# Patient Record
Sex: Female | Born: 1975 | Hispanic: No | Marital: Married | Smoking: Never smoker
Health system: Southern US, Community
[De-identification: ages and names within clinical notes are randomized; demographics above are authoritative.]

## PROBLEM LIST (undated history)

## (undated) DIAGNOSIS — T7840XA Allergy, unspecified, initial encounter: Secondary | ICD-10-CM

## (undated) DIAGNOSIS — N750 Cyst of Bartholin's gland: Secondary | ICD-10-CM

## (undated) DIAGNOSIS — B977 Papillomavirus as the cause of diseases classified elsewhere: Secondary | ICD-10-CM

## (undated) DIAGNOSIS — J302 Other seasonal allergic rhinitis: Secondary | ICD-10-CM

## (undated) DIAGNOSIS — N870 Mild cervical dysplasia: Secondary | ICD-10-CM

## (undated) DIAGNOSIS — Z5189 Encounter for other specified aftercare: Secondary | ICD-10-CM

## (undated) HISTORY — DX: Mild cervical dysplasia: N87.0

## (undated) HISTORY — PX: COSMETIC SURGERY: SHX468

## (undated) HISTORY — DX: Cyst of Bartholin's gland: N75.0

## (undated) HISTORY — DX: Allergy, unspecified, initial encounter: T78.40XA

## (undated) HISTORY — DX: Papillomavirus as the cause of diseases classified elsewhere: B97.7

## (undated) HISTORY — PX: BARTHOLIN CYST MARSUPIALIZATION: SHX5383

## (undated) HISTORY — DX: Other seasonal allergic rhinitis: J30.2

## (undated) HISTORY — DX: Encounter for other specified aftercare: Z51.89

---

## 1999-09-01 ENCOUNTER — Other Ambulatory Visit: Admission: RE | Admit: 1999-09-01 | Discharge: 1999-09-01 | Payer: Self-pay | Admitting: Internal Medicine

## 2001-03-26 ENCOUNTER — Other Ambulatory Visit: Admission: RE | Admit: 2001-03-26 | Discharge: 2001-03-26 | Payer: Self-pay | Admitting: Gynecology

## 2002-11-03 ENCOUNTER — Other Ambulatory Visit: Admission: RE | Admit: 2002-11-03 | Discharge: 2002-11-03 | Payer: Self-pay | Admitting: Gynecology

## 2003-05-22 ENCOUNTER — Inpatient Hospital Stay (HOSPITAL_COMMUNITY): Admission: AD | Admit: 2003-05-22 | Discharge: 2003-05-22 | Payer: Self-pay | Admitting: Gynecology

## 2003-05-27 ENCOUNTER — Inpatient Hospital Stay (HOSPITAL_COMMUNITY): Admission: AD | Admit: 2003-05-27 | Discharge: 2003-05-31 | Payer: Self-pay | Admitting: Gynecology

## 2003-05-28 ENCOUNTER — Encounter (INDEPENDENT_AMBULATORY_CARE_PROVIDER_SITE_OTHER): Payer: Self-pay | Admitting: Specialist

## 2003-06-01 ENCOUNTER — Encounter: Admission: RE | Admit: 2003-06-01 | Discharge: 2003-07-01 | Payer: Self-pay | Admitting: Gynecology

## 2003-06-25 ENCOUNTER — Ambulatory Visit (HOSPITAL_COMMUNITY): Admission: RE | Admit: 2003-06-25 | Discharge: 2003-06-25 | Payer: Self-pay | Admitting: Gynecology

## 2003-06-25 ENCOUNTER — Ambulatory Visit (HOSPITAL_BASED_OUTPATIENT_CLINIC_OR_DEPARTMENT_OTHER): Admission: RE | Admit: 2003-06-25 | Discharge: 2003-06-25 | Payer: Self-pay | Admitting: Gynecology

## 2003-07-27 ENCOUNTER — Other Ambulatory Visit: Admission: RE | Admit: 2003-07-27 | Discharge: 2003-07-27 | Payer: Self-pay | Admitting: Gynecology

## 2005-01-03 ENCOUNTER — Other Ambulatory Visit: Admission: RE | Admit: 2005-01-03 | Discharge: 2005-01-03 | Payer: Self-pay | Admitting: Gynecology

## 2006-03-29 ENCOUNTER — Other Ambulatory Visit: Admission: RE | Admit: 2006-03-29 | Discharge: 2006-03-29 | Payer: Self-pay | Admitting: Gynecology

## 2007-05-08 ENCOUNTER — Other Ambulatory Visit: Admission: RE | Admit: 2007-05-08 | Discharge: 2007-05-08 | Payer: Self-pay | Admitting: Gynecology

## 2008-05-08 ENCOUNTER — Encounter: Payer: Self-pay | Admitting: Gynecology

## 2008-05-08 ENCOUNTER — Other Ambulatory Visit: Admission: RE | Admit: 2008-05-08 | Discharge: 2008-05-08 | Payer: Self-pay | Admitting: Gynecology

## 2008-05-08 ENCOUNTER — Ambulatory Visit: Payer: Self-pay | Admitting: Gynecology

## 2008-05-11 DIAGNOSIS — B977 Papillomavirus as the cause of diseases classified elsewhere: Secondary | ICD-10-CM

## 2008-05-11 DIAGNOSIS — N870 Mild cervical dysplasia: Secondary | ICD-10-CM

## 2008-05-11 HISTORY — DX: Papillomavirus as the cause of diseases classified elsewhere: B97.7

## 2008-05-11 HISTORY — DX: Mild cervical dysplasia: N87.0

## 2008-05-14 ENCOUNTER — Ambulatory Visit: Payer: Self-pay | Admitting: Gynecology

## 2008-11-03 ENCOUNTER — Other Ambulatory Visit: Admission: RE | Admit: 2008-11-03 | Discharge: 2008-11-03 | Payer: Self-pay | Admitting: Gynecology

## 2008-11-03 ENCOUNTER — Encounter: Payer: Self-pay | Admitting: Gynecology

## 2008-11-03 ENCOUNTER — Ambulatory Visit: Payer: Self-pay | Admitting: Gynecology

## 2009-02-02 ENCOUNTER — Ambulatory Visit: Payer: Self-pay | Admitting: Gynecology

## 2009-05-14 ENCOUNTER — Other Ambulatory Visit: Admission: RE | Admit: 2009-05-14 | Discharge: 2009-05-14 | Payer: Self-pay | Admitting: Gynecology

## 2009-05-14 ENCOUNTER — Ambulatory Visit: Payer: Self-pay | Admitting: Gynecology

## 2009-05-21 ENCOUNTER — Ambulatory Visit: Payer: Self-pay | Admitting: Gynecology

## 2010-05-19 ENCOUNTER — Other Ambulatory Visit: Payer: Self-pay | Admitting: Gynecology

## 2010-05-19 ENCOUNTER — Ambulatory Visit
Admission: RE | Admit: 2010-05-19 | Discharge: 2010-05-19 | Payer: Self-pay | Source: Home / Self Care | Attending: Gynecology | Admitting: Gynecology

## 2010-05-19 ENCOUNTER — Other Ambulatory Visit (HOSPITAL_COMMUNITY)
Admission: RE | Admit: 2010-05-19 | Discharge: 2010-05-19 | Disposition: A | Discharge status: Home / Self Care | Payer: PRIVATE HEALTH INSURANCE | Source: Ambulatory Visit | Attending: Gynecology | Admitting: Gynecology

## 2010-05-19 DIAGNOSIS — Z124 Encounter for screening for malignant neoplasm of cervix: Secondary | ICD-10-CM | POA: Insufficient documentation

## 2010-06-03 ENCOUNTER — Other Ambulatory Visit: Payer: Self-pay

## 2010-06-23 ENCOUNTER — Other Ambulatory Visit (INDEPENDENT_AMBULATORY_CARE_PROVIDER_SITE_OTHER): Payer: PRIVATE HEALTH INSURANCE

## 2010-06-23 DIAGNOSIS — E559 Vitamin D deficiency, unspecified: Secondary | ICD-10-CM

## 2010-07-07 ENCOUNTER — Other Ambulatory Visit: Payer: PRIVATE HEALTH INSURANCE
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
## 2010-09-09 NOTE — Discharge Summary (Signed)
NAME:  Heather Orozco, Heather Orozco                         ACCOUNT NO.:  [id number]   MEDICAL RECORD NO.:  [id number]                   PATIENT TYPE:  INP   LOCATION:  9146                                 FACILITY:  [facility]   PHYSICIAN:  Juan H. Lily Peer, M.D.             DATE OF BIRTH:  [birth year]   DATE OF ADMISSION:  05/27/2003  DATE OF DISCHARGE:  05/31/2003                                 DISCHARGE SUMMARY   DISCHARGE DIAGNOSES:  1. Intrauterine pregnancy at 39+ weeks delivered.  2. Nonreassuring fetal heart rate tracing.  3. Intrauterine growth restriction versus small for gestational age.  4. Postpartum uterine atony status post primary lower uterine segment     transverse cesarean section by Dr. Reynaldo Minium on May 28, 2003.   HISTORY:  This is a 35 years of age female gravida 1, para 0 with an EDC of  May 31, 2003.  Prenatal course had been complicated by having a  Bartholin abscess which was drained, placed on antibiotics and a Word  catheter removed.  Was also followed in pregnancy for suspected IUGR versus  small for gestational age with serial ultrasounds.  Last ultrasound on  January 24th demonstrated estimated fetal age by ultrasound 37 weeks,  estimated fetal weight of 3055 grams in the 35th percentile with 38 weeks  and abdominal circumference consistent with 35 weeks and 6 days.  Amniotic  fluid index was 9.5 which is 20th percentile for 38 weeks.  Doppler flow  studies were normal.  The patient was admitted for induction secondary to  same.   HOSPITAL COURSE:  On May 27, 2003, the patient was admitted, Cervidil  was placed and on May 28, 2003 the patient was begun on Pitocin.  There  was a flat baseline that they thought secondary to Stadol x2 but persisted  to have decreased long-term variability on fetal heart rate tracing and  after fetal scalp stimulation there was a fetal heart rate deceleration.  Therefore, the diagnosis of nonreassuring fetal heart rate  tracing was made  and therefore the patient underwent a primary lower uterine segment  transverse cesarean section by Dr. Reynaldo Minium on May 28, 2003 and  underwent delivery of a female.  Apgars of 9 and 9, weight of 6 pounds and 9  ounces.  It was noted that there was postpartum uterine atony and she was  given Pitocin, Methergine, and Hemabate which were injected into the  myometrium along with manual pressure and eventually the uterus regained its  tonicity.  Postoperatively, the patient remained afebrile, voiding, in  stable condition and she was discharged to home on May 31, 2003 and  given [facility] postoperative instructions and postpartum  booklet.   LABORATORY DATA:  The patient is O positive, rubella immune.  On May 29, 2003, hemoglobin was 10.3.   DISPOSITION:  The patient is discharged to home.  She was to follow up in  24  hours to discontinue staples.  She was given a prescription for Darvocet  p.r.n. pain, dispense #30.  If she had any problems prior to then, please  see [place] in the office.     Susa Loffler, P.A.                    Juan H. Lily Peer, M.D.    TSG/MEDQ  D:  06/29/2003  T:  06/29/2003  Job:  [id number]
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
## 2010-09-09 NOTE — Op Note (Signed)
NAME:  Heather Orozco, Heather Orozco                         ACCOUNT NO.:  [id number]   MEDICAL RECORD NO.:  [id number]                   PATIENT TYPE:  INP   LOCATION:  9146                                 FACILITY:  [facility]   PHYSICIAN:  Juan H. Lily Peer, M.D.             DATE OF BIRTH:  [birth year]   DATE OF PROCEDURE:  05/28/2003  DATE OF DISCHARGE:                                 OPERATIVE REPORT   INDICATION FOR OPERATION:  A 35 year old, gravida 1, para 0, at 39-1/2 weeks  estimated gestational age, who was admitted last night for induction,  secondary suspicion of SGA versus IUGR.  The patient was started on Cervidil  and had received intravenous narcotics for contraction pains during the  night and after the effects wore off, persistence of decreased long-term  variability on fetal heart tracing was evident and after fetal scalp  stimulation, there was fetal heart rate deceleration.  The patient had been  kept on O2 in lateral decubitus position, and she had progressed only to 3  cm, 80%, -2 station.  Clear amniotic fluid and was afebrile.   PREOPERATIVE DIAGNOSES:  1. Term intrauterine pregnancy.  2. Nonreassuring fetal heart rate tracing.  3. Intrauterine growth restriction versus small for gestational age.   POSTOPERATIVE DIAGNOSES:  1. Term intrauterine pregnancy.  2. Nonreassuring fetal heart rate tracing.  3. Small for gestational age.  4. Postpartum uterine atony.   ANESTHESIA:  Epidural.   SURGEON:  Juan H. Lily Peer, M.D.   PROCEDURE PERFORMED:  Primary lower uterine segment transverse cesarean  section.   FINDINGS:  Clear amniotic fluid.  Viable female infant, Apgars of 9 and 9  with a weight of 6 pounds 9 ounces.  Arterial cord pH is 7.22, normal  maternal pelvic anatomy.   DESCRIPTION OF OPERATION:  After the patient was adequately counseled, she  was taken to the operating room where she was re-dosed through her epidural  catheter, and her abdomen was prepped and  draped in the usual sterile  fashion.  A Foley catheter was inserted in an effort to monitor urinary  output.  A Pfannenstiel skin incision was made 2 cm above the symphysis  pubis, and the incision was carried down through the skin, subcutaneous  tissue, down to the rectus fascia, whereby a midline nick was made.  The  fascia was incised in transverse fashion.  Clear amniotic fluid was present.  The newborn was delivered without any complication.  The cord was doubly  clamped and excised.  The nasopharyngeal area was bulb suctioned.  The  newborn gave immediate cry, was shown to the parents, and passed off to the  pediatricians who were in attendance.  After cord blood was obtained, the  placenta was delivered from the intrauterine cavity and submitted for  histological evaluation.  Pitocin drip was started along with Cefotan 1 g IV  for prophylaxis during the closure.  It was noted  that the uterus was  atonic.  She was given 40 units of Pitocin and later LVAR and received  Methergine 0.2 mg IM and also Hemabate 250 mcg were injected into the  myometrium along with manual pressure and eventually the uterus regained its  tonicity.  The closure was completed with a 0 Vicryl suture in the  transverse uterine incision.  There was normal-appearing tubes and ovaries.  The uterus was placed back into the pelvic cavity.  The pelvic cavity was  copiously irrigated with normal saline solution.  The visceral peritoneum  was not reapproximated, but the rectus fascia was closed with a running  stitch of 0 Vicryl suture.  The subcutaneous bleeders were Bovie cauterized;  the skin was reapproximated with skin clips followed by placing Xeroform  gauze and 4 x 8 dressing.  The patient was transferred to recovery room with  stable vital signs.  Blood loss was 800 mL.  IV fluid was 2300 mL of  lactated Ringer's.  Urine output 250 mL and clear.  She received a second  dose of Methergine 0.2 mg IM and will  continue to receive same dosage but  every 4 hours for 4 additional doses.                                               Juan H. Lily Peer, M.D.    JHF/MEDQ  D:  05/28/2003  T:  05/28/2003  Job:  [id number]

## 2010-09-09 NOTE — Op Note (Signed)
NAME:  Heather Orozco, Heather Orozco                         ACCOUNT NO.:  [id number]   MEDICAL RECORD NO.:  [id number]                   PATIENT TYPE:  AMB   LOCATION:  [facility]                                 FACILITY:  [facility]   PHYSICIAN:  Juan H. Lily Peer, M.D.             DATE OF BIRTH:  [birth year]   DATE OF PROCEDURE:  06/25/2003  DATE OF DISCHARGE:                                 OPERATIVE REPORT   INDICATION FOR OPERATION:  A 35 year old with recurrent left Bartholin gland  cyst/abscess.   PREOPERATIVE DIAGNOSIS:  Left Bartholin gland cyst/abscess, recurrent.   POSTOPERATIVE DIAGNOSIS:  Left Bartholin gland cyst/abscess, recurrent.   ANESTHESIA:  Intravenous sedation with local 1% lidocaine.   SURGEON:  Juan H. Lily Peer, M.D.   PROCEDURE PERFORMED:  Marsupialization of recurrent left Bartholin gland.   FINDINGS:  A 3 x 4 cm Bartholin gland, inflamed upon incision into the  gland.  Purulent material was evident, consistent with a Bartholin gland  abscess.  Contralateral Bartholin gland was normal.   DESCRIPTION OF OPERATION:  After the patient was adequately counseled, she  was taken to the operating room where after intravenous sedation, her vagina  and perineum were prepped and draped in the usual sterile fashion.  A red  rubber Roxan Hockey was inserted to evacuate her bladder's content for  approximately 50 mL.  Xylocaine 1% was infiltrated into the submucosa  overlying the inflamed Bartholin gland after Betadine solution was used to  prep the area.  A stab incision was made, and purulent discharge was  evident.  The gland cavity was irrigated with copious amounts of normal  saline solution, and the gland edges were everted and then sutured  independently with 3-0 Vicryl suture to the vaginal mucosa to keep it patent  for several weeks.  After the marsupialization was completed, the area was  irrigated once again, and the patient was transferred to recovery room with  stable vital signs.   Blood loss was minimal.  Fluid resuscitation consisted  of 800 mL of lactated Ringer's.  She did receive 2 g of Cefotan IV and was  transferred to recovery room with stable vital sign, and she was given 30 mg  of Toradol IV en route to the recovery room.                                               Juan H. Lily Peer, M.D.    JHF/MEDQ  D:  06/25/2003  T:  06/25/2003  Job:  [id number]

## 2011-05-26 ENCOUNTER — Encounter: Payer: PRIVATE HEALTH INSURANCE | Admitting: Gynecology

## 2011-06-12 ENCOUNTER — Encounter: Payer: Self-pay | Admitting: Gynecology

## 2011-06-12 ENCOUNTER — Ambulatory Visit (INDEPENDENT_AMBULATORY_CARE_PROVIDER_SITE_OTHER): Payer: PRIVATE HEALTH INSURANCE | Admitting: Gynecology

## 2011-06-12 VITALS — BP 118/78 | Ht 65.0 in | Wt 130.0 lb

## 2011-06-12 DIAGNOSIS — Z0000 Encounter for general adult medical examination without abnormal findings: Secondary | ICD-10-CM

## 2011-06-12 DIAGNOSIS — Z01419 Encounter for gynecological examination (general) (routine) without abnormal findings: Secondary | ICD-10-CM

## 2011-06-12 LAB — URINALYSIS W MICROSCOPIC + REFLEX CULTURE
Leukocytes, UA: NEGATIVE
Nitrite: NEGATIVE
Protein, ur: NEGATIVE mg/dL
Specific Gravity, Urine: 1.02 (ref 1.005–1.030)
Urobilinogen, UA: 0.2 mg/dL (ref 0.0–1.0)

## 2011-06-12 LAB — CBC WITH DIFFERENTIAL/PLATELET
Basophils Absolute: 0 10*3/uL (ref 0.0–0.1)
Basophils Relative: 1 % (ref 0–1)
HCT: 40 % (ref 36.0–46.0)
Lymphocytes Relative: 22 % (ref 12–46)
MCHC: 32.8 g/dL (ref 30.0–36.0)
Monocytes Absolute: 0.4 10*3/uL (ref 0.1–1.0)
Neutro Abs: 4.7 10*3/uL (ref 1.7–7.7)
Neutrophils Relative %: 72 % (ref 43–77)
Platelets: 239 10*3/uL (ref 150–400)
RDW: 11.9 % (ref 11.5–15.5)
WBC: 6.6 10*3/uL (ref 4.0–10.5)

## 2011-06-12 NOTE — Progress Notes (Signed)
Renda Pohlman 1976-03-22 [id number]   History:    36 y.o.  for annual exam with no complaints. Patient a Mirena IUD placed December 2008. Patient does her monthly self breast examination. 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  Past medical history,surgical history, family history and social history were all reviewed and documented in the EPIC chart.  Gynecologic History Patient's last menstrual period was 06/08/2011. Contraception: IUD Last Pap: 2012. Results were:normal} Last mammogram: No prior study. Results were: No prior study  Obstetric History OB History    Grav Para Term Preterm Abortions TAB SAB Ect Mult Living   1 1 1       1      # Outc Date GA Lbr Len/2nd Wgt Sex Del Anes PTL Lv   1 TRM     F CS  No Yes       ROS:  Was performed and pertinent positives and negatives are included in the history.  Exam: chaperone present  BP 118/78  Ht 5\' 5"  (1.651 m)  Wt 130 lb (58.968 kg)  BMI 21.63 kg/m2  LMP 06/08/2011  Body mass index is 21.63 kg/(m^2).  General appearance : Well developed well nourished female. No acute distress HEENT: Neck supple, trachea midline, no carotid bruits, no thyroidmegaly Lungs: Clear to auscultation, no rhonchi or wheezes, or rib retractions  Heart: Regular rate and rhythm, no murmurs or gallops Breast:Examined in sitting and supine position were symmetrical in appearance, no palpable masses or tenderness,  no skin retraction, no nipple inversion, no nipple discharge, no skin discoloration, no axillary or supraclavicular lymphadenopathy Abdomen: no palpable masses or tenderness, no rebound or guarding Extremities: no edema or skin discoloration or tenderness  Pelvic:  Bartholin, Urethra, Skene Glands: Within normal limits             Vagina: No gross lesions or discharge  Cervix: No gross lesions or discharge, IUD string seen  Uterus  anteverted, normal size, shape and consistency, non-tender and mobile  Adnexa   Without masses or tenderness  Anus and perineum  normal   Rectovaginal  normal sphincter tone without palpated masses or tenderness             Hemoccult not done     Assessment/Plan:  36 y.o. female for annual exam unremarkable. New screening guidelines her Pap smear discussed. She will have a Pap smear in 2 years. Patient knows that she needs to come back to the office in November to change her IUD which will be expired. She was instructed to continue monthly self breast examinations. We discussed importance of calcium and vitamin D as well as regular exercise for osteoporosis prevention. CBC urinalysis and and cholesterol was obtained today.    Ok Edwards MD, 2:30 PM 06/12/2011

## 2011-06-12 NOTE — Patient Instructions (Signed)
Calcium requirement 1200mg  daily and vitamin D 1000-2000

## 2011-06-13 LAB — LIPID PANEL
Cholesterol: 122 mg/dL (ref 0–200)
Total CHOL/HDL Ratio: 3.3 Ratio
VLDL: 10 mg/dL (ref 0–40)

## 2012-01-19 ENCOUNTER — Telehealth: Payer: Self-pay | Admitting: Gynecology

## 2012-01-19 NOTE — Telephone Encounter (Signed)
Pt was advised today that her [employer] ins covers the removal of her existing Mirena, the new Mirena IUD and its insertion Under her $25.00 ov copay. She will call back to shedule an appt for November per JF notes/WL

## 2012-02-23 HISTORY — PX: OTHER SURGICAL HISTORY: SHX169

## 2012-03-20 ENCOUNTER — Ambulatory Visit (INDEPENDENT_AMBULATORY_CARE_PROVIDER_SITE_OTHER): Payer: PRIVATE HEALTH INSURANCE | Admitting: Gynecology

## 2012-03-20 ENCOUNTER — Other Ambulatory Visit: Payer: Self-pay | Admitting: Gynecology

## 2012-03-20 ENCOUNTER — Encounter: Payer: Self-pay | Admitting: Gynecology

## 2012-03-20 VITALS — BP 116/70

## 2012-03-20 DIAGNOSIS — Z3049 Encounter for surveillance of other contraceptives: Secondary | ICD-10-CM

## 2012-03-20 DIAGNOSIS — Z30433 Encounter for removal and reinsertion of intrauterine contraceptive device: Secondary | ICD-10-CM

## 2012-03-20 MED ORDER — LEVONORGESTREL 20 MCG/24HR IU IUD: INTRAUTERINE_SYSTEM | Freq: Once | INTRAUTERINE | Status: DC

## 2012-03-20 NOTE — Progress Notes (Signed)
Patient presented to the office today to change her Mirena IUD. It was last placed in December 2008. Patient has done well with no problems. Patient read the literature information. Consent form was signed. Patient's fully where this form of contraception is good for 5 years and is 99% effective.  Exam : Pelvic Bartholin urethra Skene was within normal limits Vagina: No lesions or discharge Cervix: No lesions or discharge Uterus: Anteverted normal size shape and consistency Adnexa: No palpable masses or tenderness Rectal exam: Not done  Procedure note: The cervix was cleansed with Betadine solution. The IUD string was visualized and grasped with a [place] clamp and the IUD was retrieved shown to the patient and discarded. A single-tooth tenaculum was placed on the anterior cervical lip. The uterus sounded to 8 cm. A sterile Mirena IUD was introduced and placed into the intrauterine cavity and the string was trimmed. The single-tooth tenaculum was removed. Patient tolerated procedure well. Patient was given 2 Aleve.  Patient will return first week in February for her annual exam in for followup on the IUD. The string is tomorrow and her partner feels it she will return before to have the string trimmed. Patient refused the flu vaccine.

## 2012-03-20 NOTE — Patient Instructions (Addendum)
Intrauterine Device Information  An intrauterine device (IUD) is inserted into your uterus and prevents pregnancy. There are 2 types of IUDs available:  · Copper IUD. This type of IUD is wrapped in copper wire and is placed inside the uterus. Copper makes the uterus and fallopian tubes produce a fluid that kills sperm. The copper IUD can stay in place for 10 years.  · Hormone IUD. This type of IUD contains the hormone progestin (synthetic progesterone). The hormone thickens the cervical mucus and prevents sperm from entering the uterus, and it also thins the uterine lining to prevent implantation of a fertilized egg. The hormone can weaken or kill the sperm that get into the uterus. The hormone IUD can stay in place for 5 years.  Your caregiver will make sure you are a good candidate for a contraceptive IUD. Discuss with your caregiver the possible side effects.  ADVANTAGES  · It is highly effective, reversible, long-acting, and low maintenance.  · There are no estrogen-related side effects.  · An IUD can be used when breastfeeding.  · It is not associated with weight gain.  · It works immediately after insertion.  · The copper IUD does not interfere with your female hormones.  · The progesterone IUD can make heavy menstrual periods lighter.  · The progesterone IUD can be used for 5 years.  · The copper IUD can be used for 10 years.  DISADVANTAGES  · The progesterone IUD can be associated with irregular bleeding patterns.  · The copper IUD can make your menstrual flow heavier and more painful.  · You may experience cramping and vaginal bleeding after insertion.  Document Released: 03/14/2004 Document Revised: 07/03/2011 Document Reviewed: 08/13/2010  [employer]® Patient Information ©2013 [employer].

## 2012-06-06 ENCOUNTER — Encounter: Payer: PRIVATE HEALTH INSURANCE | Admitting: Gynecology

## 2012-06-13 ENCOUNTER — Encounter: Payer: PRIVATE HEALTH INSURANCE | Admitting: Gynecology

## 2012-06-24 ENCOUNTER — Encounter: Payer: PRIVATE HEALTH INSURANCE | Admitting: Gynecology

## 2012-06-25 ENCOUNTER — Ambulatory Visit (INDEPENDENT_AMBULATORY_CARE_PROVIDER_SITE_OTHER): Payer: PRIVATE HEALTH INSURANCE | Admitting: Gynecology

## 2012-06-25 ENCOUNTER — Encounter: Payer: Self-pay | Admitting: Gynecology

## 2012-06-25 VITALS — BP 108/70 | Ht 65.25 in | Wt 142.0 lb

## 2012-06-25 DIAGNOSIS — Z01419 Encounter for gynecological examination (general) (routine) without abnormal findings: Secondary | ICD-10-CM

## 2012-06-25 NOTE — Progress Notes (Signed)
Heather Orozco 14-Apr-1976 [id number]   History:    36 y.o.  for annual gyn exam with no complaints today. Patient was seen in the office in November 2013 whereby her Mirena IUD was changed. Patient states she's having light menstrual cycles. She is doing her monthly self breast examination. 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  Gynecologic History Patient's last menstrual period was 06/23/2012. Contraception: IUD Last Pap: 2012. Results were: normal Last mammogram: not indicated. Results were: not indicated  Obstetric History OB History   Grav Para Term Preterm Abortions TAB SAB Ect Mult Living   1 1 1       1      # Outc Date GA Lbr Len/2nd Wgt Sex Del Anes PTL Lv   1 TRM     F CS  No Yes       ROS: A ROS was performed and pertinent positives and negatives are included in the history.  GENERAL: No fevers or chills. HEENT: No change in vision, no earache, sore throat or sinus congestion. NECK: No pain or stiffness. CARDIOVASCULAR: No chest pain or pressure. No palpitations. PULMONARY: No shortness of breath, cough or wheeze. GASTROINTESTINAL: No abdominal pain, nausea, vomiting or diarrhea, melena or bright red blood per rectum. GENITOURINARY: No urinary frequency, urgency, hesitancy or dysuria. MUSCULOSKELETAL: No joint or muscle pain, no back pain, no recent trauma. DERMATOLOGIC: No rash, no itching, no lesions. ENDOCRINE: No polyuria, polydipsia, no heat or cold intolerance. No recent change in weight. HEMATOLOGICAL: No anemia or easy bruising or bleeding. NEUROLOGIC: No headache, seizures, numbness, tingling or weakness. PSYCHIATRIC: No depression, no loss of interest in normal activity or change in sleep  pattern.     Exam: chaperone present  BP 108/70  Ht 5' 5.25" (1.657 m)  Wt 142 lb (64.411 kg)  BMI 23.46 kg/m2  LMP 06/23/2012  Body mass index is 23.46 kg/(m^2).  General appearance : Well developed well nourished female. No acute distress HEENT: Neck supple, trachea midline, no carotid bruits, no thyroidmegaly Lungs: Clear to auscultation, no rhonchi or wheezes, or rib retractions  Heart: Regular rate and rhythm, no murmurs or gallops Breast:Examined in sitting and supine position were symmetrical in appearance, no palpable masses or tenderness,  no skin retraction, no nipple inversion, no nipple discharge, no skin discoloration, no axillary or supraclavicular lymphadenopathy Abdomen: no palpable masses or tenderness, no rebound or guarding Extremities: no edema or skin discoloration or tenderness  Pelvic:  Bartholin, Urethra, Skene Glands: Within normal limits             Vagina: No gross lesions or discharge, menstrual blood present  Cervix: No gross lesions or discharge, IUD string seen  Uterus  anteverted, normal size, shape and consistency, non-tender and mobile  Adnexa  Without masses or tenderness  Anus and perineum  normal   Rectovaginal  normal sphincter tone without palpated masses or tenderness             Hemoccult not indicated     Assessment/Plan:  37 y.o. female for annual exam who stated that she wanted to have her blood drawn done at her work since she was in a rush today. I have given her a prescription pad with the following requested lab work: CBC, fasting  lipid profile, TSH, fasting blood sugar and urinalysis. No Pap smear was done today the new guidelines were discussed. She was reminded to do her monthly self breast examination. Literature information on the Tdap vaccine was provided.    Ok Edwards MD, 10:32 AM 06/25/2012

## 2012-06-25 NOTE — Patient Instructions (Signed)

## 2012-06-27 ENCOUNTER — Encounter: Payer: PRIVATE HEALTH INSURANCE | Admitting: Gynecology

## 2013-07-31 ENCOUNTER — Encounter: Payer: Self-pay | Admitting: Gynecology

## 2013-08-15 ENCOUNTER — Other Ambulatory Visit (HOSPITAL_COMMUNITY)
Admission: RE | Admit: 2013-08-15 | Discharge: 2013-08-15 | Disposition: A | Discharge status: Home / Self Care | Payer: PRIVATE HEALTH INSURANCE | Source: Ambulatory Visit | Attending: Gynecology | Admitting: Gynecology

## 2013-08-15 ENCOUNTER — Ambulatory Visit (INDEPENDENT_AMBULATORY_CARE_PROVIDER_SITE_OTHER): Payer: PRIVATE HEALTH INSURANCE | Admitting: Gynecology

## 2013-08-15 ENCOUNTER — Encounter: Payer: Self-pay | Admitting: Gynecology

## 2013-08-15 VITALS — BP 112/76 | Ht 65.25 in | Wt 136.0 lb

## 2013-08-15 DIAGNOSIS — Z01419 Encounter for gynecological examination (general) (routine) without abnormal findings: Secondary | ICD-10-CM | POA: Insufficient documentation

## 2013-08-15 DIAGNOSIS — Z975 Presence of (intrauterine) contraceptive device: Secondary | ICD-10-CM

## 2013-08-15 DIAGNOSIS — Z1151 Encounter for screening for human papillomavirus (HPV): Secondary | ICD-10-CM | POA: Insufficient documentation

## 2013-08-15 DIAGNOSIS — Z23 Encounter for immunization: Secondary | ICD-10-CM

## 2013-08-15 DIAGNOSIS — N750 Cyst of Bartholin's gland: Secondary | ICD-10-CM | POA: Insufficient documentation

## 2013-08-15 NOTE — Patient Instructions (Addendum)
Vacuna difteria, tétanos, tos ferina (DTP) - Lo que debe saber   (Tetanus, Diphtheria, Pertussis [Tdap] Vaccine, What You Need to Know)  ¿PORQUÉ VACUNARSE?   El tétanos, la difteria y la tos ferina pueden ser enfermedades muy graves, aún en adolescentes y adultos. La vacuna Tdap nos puede proteger de estas enfermedades.   El TÉTANOS (Trismo) provoca la contracción dolorosa de los músculos, por lo general, en todo el cuerpo.   · Puede causar el endurecimiento de los músculos de la cabeza y el cuello, de modo que impide abrir la boca, tragar y en algunos casos, respirar. El tétanos causa la muerte de 1 de cada 5 personas que se infectan.  La DIFTERIA produce la formación de una membrana gruesa que cubre el fondo de la garganta.   · Puede causar problemas respiratorios, parálisis, insuficiencia cardíaca e incluso la muerte.  TOS FERINA (Pertusis) causa episodios de tos graves, que pueden hacer difícil la respiración, causar vómitos y trastornos del sueño.   · También puede ser la causa de pérdida de peso, incontinencia y fractura de costillas. Dos de cada 100 adolescentes y cinco de cada 100 adultos que enferman de pertusis deben ser hospitalizados, tienen complicaciones como la neumonía o mueren.  Estas enfermedades son provocadas por bacterias. La difteria y el pertusis se contagian de persona a persona a través de la tos o el estornudo. El tétanos ingresa al organismo a través de cortes, rasguños o heridas.   Antes de las vacunas, en los [place] se vieron más de 200.000 casos al año de difteria y tos ferina y cientos de casos de tétanos. Desde el inicio de la vacunación, los casos de tétanos y difteria han disminuido alrededor del 99% y los casos de tos ferina alrededor del 80%.   Tdap   La vacuna Tdap protege a adolescentes y adultos contra el tétanos, la difteria y la tos ferina. Una dosis de Tdap se administra a los 11 o 12 años de edad. Las personas que no recibieron la vacuna Tdap a esa edad deben  recibirla tan pronto como sea posible.   Es muy importante que los profesionales de la salud y todos aquellos que tengan contacto cercano con bebés menores de 12 meses reciban la Tdap.   Las mujeres embarazadas deben recibir una dosis de Tdap en cada embarazo, para proteger al recién nacido de la tos ferina. Los niños tienen mayor riesgo de complicaciones graves y potencialmente mortales debido a la tos ferina.   Una vacuna similar, llamada Td, protege contra el tétanos y la difteria, pero no contra la tos ferina. Cada 10 años debe recibirse un refuerzo de Td. La Tdap se puede administrar como uno de estos refuerzos, si todavía no ha recibido una dosis. También se puede aplicar después de un corte o quemadura grave para prevenir la infección por tétanos.   El médico le dará más información.   La Tdap puede administrarse de manera segura simultáneamente con otras vacunas.   ALGUNAS PERSONAS NO DEBEN RECIBIR ESTA VACUNA.   · Si alguna vez tuvo una reacción alérgica potencialmente mortal después de una dosis de la vacuna contra el tétanos, la diferia o la tos ferina, o tuvo una alergia grave a cualquiera de los componentes de esta vacuna, no debe aplicarse la vacuna. Informe a su médico si usted sufre algún tipo de alergia grave.  · Si estuvo en coma o sufrió múltiples convulsiones dentro de los 7 días posteriores después de una dosis de DTP o DTaP   su mdico si:  tiene epilepsia u otra enfermedad del sistema nervioso,  siente dolor intenso o se hincha despus de recibir cualquier vacuna contra la difteria, el ttanos o la tos ferina,  alguna vez ha sufrido el sndrome de Guillain-Barr,  no se siente bien el da en que se ha programado la vacuna. RIESGOS DE UNA REACCIN A LA VACUNA Con cualquier medicamento, incluyendo las vacunas, existe la posibilidad de que aparezcan efectos secundarios. Estos son  leves y desaparecen por s solos, pero tambin son posibles las reacciones graves.  Breves episodios de desmayo pueden seguir a una vacunacin, causando lesiones por la cada. Sentarse o recostarse durante 15 minutos puede ayudar a evitarlo. Informe al mdico si se siente mareado o aturdido, tiene cambios en la visin o zumbidos en los odos.  Problemas leves luego de la Tdap (no interferirn con las actividades)   Dolor en el sitio de la inyeccin (alrededor de 1 de cada 4 adolescentes o 2 de cada 3 adultos).  Enrojecimiento o hinchazn en el lugar de la inyeccin (1 de cada 5 personas).  Fiebre leve de al menos 100,4 F (38 C) (hasta alrededor de 1 cada 25 adolescentes y 1 de cada 100 adultos).  Dolor de cabeza (3 o 4de cada 10 personas).  Cansancio (1 de cada 3 o 4 personas).  Nuseas, vmitos, diarrea, dolor de estmago (1 de cada 4 adolescentes o 1 de cada 10 adultos).  Escalofros, dolores corporales, dolor articular, erupciones, inflamacin de las glndulas (poco frecuente). Problemas moderados: (interfieren con las actividades, pero no requieren atencin mdica)   Dolor en el lugar de la inyeccin (1 de cada 5 adolescentes o 1 de cada 100 adultos).  Enrojecimiento o inflamacin (1 de cada 16 adolescentes y 1 de cada 25 adultos).  Fiebre de ms de 102F o 38,9C (1 de cada 100 adolescentes o 1 de cada 250 adultos).  Dolor de cabeza (alrededor de 4 de cada 20 adolescentes y 3 de cada 10 adultos).  Nuseas, vmitos, diarrea, dolor de estmago (1 a 3 de cada 100 personas).  Hinchazn de todo el brazo en el que se aplic la vacuna (3 de cada100 personas). Problemas graves: luego de la Tdap (no puede realizar las actividades habituales, requiere atencin mdica)   Inflamacin, dolor intenso, sangrado y enrojecimiento en el brazo, en el sitio de la inyeccin (poco frecuente). Una reaccin alrgica grave puede ocurrir despus de la administracin de cualquier vacuna (se estima en  menos de 1 en un milln de dosis).  QU PASA SI HAY UNA REACCIN GRAVE?  Qu signos debo buscar?  Observe todo lo que le preocupe, como signos de una reaccin alrgica grave, fiebre muy alta o cambios en el comportamiento. Los signos de una reaccin alrgica grave pueden incluir urticaria, hinchazn de la cara y la garganta, dificultad para respirar, ritmo cardaco acelerado, mareos y debilidad. Estos sntomas pueden comenzar entre unos pocos minutos y algunas horas despus de la vacunacin.  Qu debo hacer?  Si usted piensa que se trata de una reaccin alrgica grave o de otra emergencia que no puede esperar, llame al 911 o lleve a la persona al hospital ms cercano. De lo contrario, llame a su mdico.  Despus, la reaccin debe informarse a la "Vaccine Adverse Event Reporting System" (Sistema de informacin sobre efectos adversos de las vacunas -VAERS). El mdico o usted mismo pueden realizar el informe en el sitio web del VAERS www.vaers.hhs.govo llame al 1-800-822-7967. El VAERS es slo para informar reacciones. No   brindan consejo mdico.  PROGRAMA NACIONAL DE COMPENSACIN DE DAOS POR VACUNAS  El National Vaccine Injury Kohl'sCompensation Program (VICP) es un programa federal que fue creado para compensar a las personas que puedan haber sufrido daos al recibir ciertas vacunas.  Aquellas personas que consideren que han sufrido un dao como consecuencia de una vacuna y quieren saber ms acerca del programa y como presentar Roslynn Ambleuna denuncia, pueden llamar [phone] o visite el sitio web del VICP en SpiritualWord.atwww.hrsa.gov/vaccinecompensation.  CMO PUEDO OBTENER MS INFORMACIN?   Consulte a su mdico.  Comunquese con el servicio de salud de su localidad o [address].  Comunquese con los Centros para el control y la prevencin de Child psychotherapistenfermedades (Centers for Disease Control and Prevention , CDC).  llamando al [phone] o visitando el sitio web del CDC en PicCapture.uywww.cdc.gov/vaccines. CDC Tdap Vaccine VIS  (08/31/11)  Document Released: 03/27/2012 [facility] Patient Information 2014 [place]ExitCare, [place]LL[place]. Absceso o quiste de Bartolino (Bartholin's Cyst or Abscess) Las glndulas de Bartolino son glndulas pequeas ubicadas dentro de los pliegues de la piel (labios) a los lados de la apertura de la vagina (canal del parto). Cuando el conducto de la glndula se Hiberniaobstruye, puede desarrollarse un quiste. Cuando esto ocurre, el lquido que se acumula dentro del quiste puede llegar a infectarse. Esto se conoce como absceso. La glndula de Bartolino produce una mucosidad lquida en la parte externa de la vagina durante las relaciones sexuales. SNTOMAS  Los Lyondell Chemicalpacientes que presentan un quiste pequeo no tienen problemas.  Podr sentir desde una leve molestia a un dolor intenso segn el tamao del quiste y si existe infeccin o no,  Financial risk analystentir dolor, inflamacin e hinchazn en la zona inferior de la vagina.  Dolor en las relaciones sexuales.  Presin en las zona del perineo.  Hinchazn de los labios de la vagina.  El quiste puede estar en uno o ambos lados de la vagina. DIAGNSTICO  El profesional podr observar una gran zona hinchada en la parte inferior de la vagina.  Es una zona dolorosa al tacto.  Si se trata de un absceso habr inflamacin y Engineer, miningdolor. TRATAMIENTO  En algunos casos el quiste desaparecer sin tratamiento.  Aplique compresas tibias hmedas en la zona o tome baos de asiento varias veces al da.  Le practicarn una incisin para drenar el quiste o el absceso, previa aplicacin de anestesia local.  Si se trata de un absceso le indicarn un cultivo del pus.  Y en ese caso le prescribirn un tratamiento con antibiticos.  Se realizar una abertura en la glndula, suturando los bordes para hacer la abertura ms grande (Dalton Gardensmarsupializacin).  Si aparece nuevamente el quiste o el absceso, le extirparn toda la glndula. PREVENCIN  Mantenga una buena higiene.  Higienice la zona vaginal con  jabn neutro y un pao suave.  No frote la zona al darse un bao.  Proteja la zona de la entrepierna con un apsito si realiza largos paseos en bicicleta o a caballo.  Asegrese de estar bien lubricada cuando mantenga relaciones sexuales. INSTRUCCIONES PARA EL CUIDADO DOMICILIARIO  Si su quiste o absceso ha sido abierto, pudieran haberle colocado un pequeo trozo de gasa o un drenaje para permitir que la herida supure. La gasa o el drenaje a menos que se lo indique el profesional que le asiste.  Use toallas femeninas y no tampones cuando lo necesite en caso de drenaje o sangrado.  Si le han recetado medicamentos que combaten los grmenes (antibiticos ), tmelos exactamente de la manera que le haya sido  indicada. Asegrese de terminar con todo el ciclo de antibiticos.  Utilice los medicamentos de venta libre o de prescripcin para Chief Technology Officerel dolor, Environmental health practitionerel malestar o la [place] Beachfiebre, segn se lo indique el profesional que lo asiste. SOLICITE ATENCIN MDICA DE INMEDIATO SI:  Aumenta el dolor, el enrojecimiento, la hinchazn o la supuracin.  La herida ha sangrado al punto que ha debido usar ms apsitos de los que la cantidad de apsitos sugerida por el mdico en 24 horas.  Siente escalofros.  Tiene fiebre.  Tiene algn problema (sntoma) nuevo o se agravan lo ya existentes. EST SEGURO QUE:  Comprende las instrucciones para el alta mdica.  Controlar su enfermedad.  Solicitar atencin mdica de inmediato segn las indicaciones. Document Released: 04/10/2005 Document Revised: 07/03/2011 [facility] Patient Information 2014 [place]ExitCare, MarylandLLC.

## 2013-08-15 NOTE — Progress Notes (Signed)
Heather Orozco 02/01/1976 [id number]   History:    38 y.o.  for annual gyn exam was doing well otherwise. Patient had a Mirena IUD placed November 2013. Patient is having no menstrual cycles. 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 Pap smear 2011, 2012 were normal. Patient does her mother breast exam. Patient with past history of left Bartholin duct cyst marsupialization.   Past medical history,surgical history, family history and social history were all reviewed and documented in the EPIC chart.  Gynecologic History No LMP recorded. Patient is not currently having periods (Reason: IUD). Contraception: IUD Last Pap: 2012. Results were: normal Last mammogram: Not indicated. Results were: Not indicated  Obstetric History OB History  Gravida Para Term Preterm AB SAB TAB Ectopic Multiple Living  1 1 1       1     # Outcome Date GA Lbr Len/2nd Weight Sex Delivery Anes PTL Lv  1 TRM     F CS  N Y       ROS: A ROS was performed and pertinent positives and negatives are included in the history.  GENERAL: No fevers or chills. HEENT: No change in vision, no earache, sore throat or sinus congestion. NECK: No pain or stiffness. CARDIOVASCULAR: No chest pain or pressure. No palpitations. PULMONARY: No shortness of breath, cough or wheeze. GASTROINTESTINAL: No abdominal pain, nausea, vomiting or diarrhea, melena or bright red blood per rectum. GENITOURINARY: No urinary frequency, urgency, hesitancy or dysuria. MUSCULOSKELETAL: No joint or muscle pain, no back pain, no recent trauma. DERMATOLOGIC: No rash, no itching, no lesions. ENDOCRINE: No polyuria, polydipsia, no heat or cold intolerance. No recent change in weight. HEMATOLOGICAL: No anemia or easy bruising or bleeding. NEUROLOGIC: No headache, seizures, numbness, tingling or weakness. PSYCHIATRIC: No depression, no loss of interest in normal activity or change in sleep  pattern.     Exam: chaperone present  BP 112/76  Ht 5' 5.25" (1.657 m)  Wt 136 lb (61.689 kg)  BMI 22.47 kg/m2  Body mass index is 22.47 kg/(m^2).  General appearance : Well developed well nourished female. No acute distress HEENT: Neck supple, trachea midline, no carotid bruits, no thyroidmegaly Lungs: Clear to auscultation, no rhonchi or wheezes, or rib retractions  Heart: Regular rate and rhythm, no murmurs or gallops Breast:Examined in sitting and supine position were symmetrical in appearance, no palpable masses or tenderness,  no skin retraction, no nipple inversion, no nipple discharge, no skin discoloration, no axillary or supraclavicular lymphadenopathy Abdomen: no palpable masses or tenderness, no rebound or guarding Extremities: no edema or skin discoloration or tenderness  Pelvic:  Bartholin, Urethra, Skene Glands: Right Bartholin duct cyst 3 x 3 cm             Vagina: No gross lesions or discharge  Cervix: No gross lesions or discharge, IUD string seen Uterus  anteverted, normal size, shape and consistency, non-tender and mobile  Adnexa  Without masses or tenderness  Anus and perineum  normal   Rectovaginal  normal sphincter tone without palpated masses or tenderness             Hemoccult not indicated     Assessment/Plan:  38 y.o. female for annual exam with past history of left Bartholin duct cyst marsupialization several years ago. Patient now with contralateral Bartholin duct cyst. Patient returned back to the office next week to incise and drain. She will also come and a fasting  state for her fasting blood work. Pap smear was done today in accordance to the new guidelines. This was reminded of the importance of monthly breast exams.  Note: This dictation was prepared with  Dragon/digital dictation along withSmart phrase technology. Any transcriptional errors that result from this process are unintentional.   Ok EdwardsJuan H Rorie Delmore MD, 12:28 PM 08/15/2013

## 2013-08-18 ENCOUNTER — Ambulatory Visit: Payer: PRIVATE HEALTH INSURANCE

## 2013-08-18 DIAGNOSIS — Z01419 Encounter for gynecological examination (general) (routine) without abnormal findings: Secondary | ICD-10-CM

## 2013-08-18 LAB — COMPREHENSIVE METABOLIC PANEL
ALT: 14 U/L (ref 0–35)
AST: 16 U/L (ref 0–37)
Albumin: 3.9 g/dL (ref 3.5–5.2)
Alkaline Phosphatase: 47 U/L (ref 39–117)
BUN: 20 mg/dL (ref 6–23)
CO2: 29 mEq/L (ref 19–32)
Calcium: 9.1 mg/dL (ref 8.4–10.5)
Chloride: 102 mEq/L (ref 96–112)
Creat: 0.71 mg/dL (ref 0.50–1.10)
Glucose, Bld: 90 mg/dL (ref 70–99)
Potassium: 4.2 mEq/L (ref 3.5–5.3)
Sodium: 137 mEq/L (ref 135–145)
Total Bilirubin: 0.8 mg/dL (ref 0.2–1.2)
Total Protein: 6.5 g/dL (ref 6.0–8.3)

## 2013-08-18 LAB — CBC WITH DIFFERENTIAL/PLATELET
Basophils Absolute: 0.1 10*3/uL (ref 0.0–0.1)
Basophils Relative: 1 % (ref 0–1)
EOS ABS: 0.1 10*3/uL (ref 0.0–0.7)
Eosinophils Relative: 2 % (ref 0–5)
HCT: 38.1 % (ref 36.0–46.0)
HEMOGLOBIN: 13.5 g/dL (ref 12.0–15.0)
LYMPHS ABS: 1.3 10*3/uL (ref 0.7–4.0)
Lymphocytes Relative: 19 % (ref 12–46)
MCH: 33.8 pg (ref 26.0–34.0)
MCHC: 35.4 g/dL (ref 30.0–36.0)
MCV: 95.3 fL (ref 78.0–100.0)
MONOS PCT: 9 % (ref 3–12)
Monocytes Absolute: 0.6 10*3/uL (ref 0.1–1.0)
Neutro Abs: 4.6 10*3/uL (ref 1.7–7.7)
Neutrophils Relative %: 69 % (ref 43–77)
Platelets: 219 10*3/uL (ref 150–400)
RBC: 4 MIL/uL (ref 3.87–5.11)
RDW: 12.6 % (ref 11.5–15.5)
WBC: 6.6 10*3/uL (ref 4.0–10.5)

## 2013-08-18 LAB — LIPID PANEL
Cholesterol: 108 mg/dL (ref 0–200)
HDL: 37 mg/dL — ABNORMAL LOW (ref >39)
LDL Cholesterol: 62 mg/dL (ref 0–99)
Total CHOL/HDL Ratio: 2.9 Ratio
Triglycerides: 46 mg/dL (ref <150)
VLDL: 9 mg/dL (ref 0–40)

## 2013-08-19 LAB — URINALYSIS W MICROSCOPIC + REFLEX CULTURE
Bilirubin Urine: NEGATIVE
Casts: NONE SEEN
Crystals: NONE SEEN
Glucose, UA: NEGATIVE mg/dL
Hgb urine dipstick: NEGATIVE
Ketones, ur: NEGATIVE mg/dL
NITRITE: NEGATIVE
PROTEIN: NEGATIVE mg/dL
Specific Gravity, Urine: 1.026 (ref 1.005–1.030)
UROBILINOGEN UA: 0.2 mg/dL (ref 0.0–1.0)
pH: 6 (ref 5.0–8.0)

## 2013-08-19 LAB — TSH: TSH: 0.99 u[IU]/mL (ref 0.350–4.500)

## 2013-08-20 LAB — URINE CULTURE
COLONY COUNT: NO GROWTH
ORGANISM ID, BACTERIA: NO GROWTH

## 2013-12-30 ENCOUNTER — Telehealth: Payer: Self-pay

## 2013-12-30 NOTE — Telephone Encounter (Signed)
Sounds like she needs an appointment to see me

## 2013-12-30 NOTE — Telephone Encounter (Signed)
Please see note below. She does know that she needs an office visit she just wanted to know what to expect if you have to do I&D.

## 2013-12-30 NOTE — Telephone Encounter (Signed)
Heather Orozco, the patient looks like a Dr. Lily Peer patient and this probably should be routed to him

## 2013-12-30 NOTE — Telephone Encounter (Signed)
Patient said bartholin duct cyst has recurred and she is assuming I&D in the office is what is going to be needed. She said she was reading about it and what she read said a catheter is place for up to six weeks to help it drained. She wanted to confirm that and see what other restrictions there might be.  She has not scheduled office visit yet.

## 2013-12-30 NOTE — Telephone Encounter (Signed)
Is on either side. It could be treated with antibiotic. He may require injecting local anesthetic and draining it here in the office. That is all I can off for now until I examine her.

## 2013-12-31 NOTE — Telephone Encounter (Signed)
Patient informed. 

## 2014-01-22 ENCOUNTER — Encounter: Payer: Self-pay | Admitting: Gynecology

## 2014-01-22 ENCOUNTER — Ambulatory Visit (INDEPENDENT_AMBULATORY_CARE_PROVIDER_SITE_OTHER): Payer: Commercial Managed Care - PPO | Admitting: Gynecology

## 2014-01-22 DIAGNOSIS — N750 Cyst of Bartholin's gland: Secondary | ICD-10-CM

## 2014-01-22 MED ORDER — DOXYCYCLINE HYCLATE 100 MG PO CAPS: ORAL_CAPSULE | ORAL | Status: DC

## 2014-01-22 MED ORDER — OXYCODONE-ACETAMINOPHEN 5-325 MG PREPACK: ORAL_TABLET | ORAL | Status: DC

## 2014-01-22 NOTE — Addendum Note (Signed)
Addended by: Richardson ChiquitoWILKINSON, Sada Mazzoni S on: 01/22/2014 04:22 PM   Modules accepted: Orders

## 2014-01-22 NOTE — Patient Instructions (Signed)
Absceso o quiste de Bartolino (Bartholin's Cyst or Abscess) Las glndulas de Bartolino son glndulas pequeas ubicadas dentro de los pliegues de la piel (labios) a los lados de la apertura de la vagina (canal del parto). Cuando el conducto de la glndula se obstruye, puede desarrollarse un quiste. Cuando esto ocurre, el lquido que se acumula dentro del quiste puede llegar a infectarse. Esto se conoce como absceso. La glndula de Bartolino produce una mucosidad lquida en la parte externa de la vagina durante las relaciones sexuales. SNTOMAS  Los pacientes que presentan un quiste pequeo no tienen problemas.  Podr sentir desde una leve molestia a un dolor intenso segn el tamao del quiste y si existe infeccin o no,  Sentir dolor, inflamacin e hinchazn en la zona inferior de la vagina.  Dolor en las relaciones sexuales.  Presin en las zona del perineo.  Hinchazn de los labios de la vagina.  El quiste puede estar en uno o ambos lados de la vagina. DIAGNSTICO  El profesional podr observar una gran zona hinchada en la parte inferior de la vagina.  Es una zona dolorosa al tacto.  Si se trata de un absceso habr inflamacin y dolor. TRATAMIENTO  En algunos casos el quiste desaparecer sin tratamiento.  Aplique compresas tibias hmedas en la zona o tome baos de asiento varias veces al da.  Le practicarn una incisin para drenar el quiste o el absceso, previa aplicacin de anestesia local.  Si se trata de un absceso le indicarn un cultivo del pus.  Y en ese caso le prescribirn un tratamiento con antibiticos.  Se realizar una abertura en la glndula, suturando los bordes para hacer la abertura ms grande (marsupializacin).  Si aparece nuevamente el quiste o el absceso, le extirparn toda la glndula. PREVENCIN  Mantenga una buena higiene.  Higienice la zona vaginal con jabn neutro y un pao suave.  No frote la zona al darse un bao.  Proteja la zona de la  entrepierna con un apsito si realiza largos paseos en bicicleta o a caballo.  Asegrese de estar bien lubricada cuando mantenga relaciones sexuales. INSTRUCCIONES PARA EL CUIDADO DOMICILIARIO  Si su quiste o absceso ha sido abierto, pudieran haberle colocado un pequeo trozo de gasa o un drenaje para permitir que la herida supure. La gasa o el drenaje a menos que se lo indique el profesional que le asiste.  Use toallas femeninas y no tampones cuando lo necesite en caso de drenaje o sangrado.  Si le han recetado medicamentos que combaten los grmenes (antibiticos ), tmelos exactamente de la manera que le haya sido indicada. Asegrese de terminar con todo el ciclo de antibiticos.  Utilice los medicamentos de venta libre o de prescripcin para el dolor, el malestar o la fiebre, segn se lo indique el profesional que lo asiste. SOLICITE ATENCIN MDICA DE INMEDIATO SI:  Aumenta el dolor, el enrojecimiento, la hinchazn o la supuracin.  La herida ha sangrado al punto que ha debido usar ms apsitos de los que la cantidad de apsitos sugerida por el mdico en 24 horas.  Siente escalofros.  Tiene fiebre.  Tiene algn problema (sntoma) nuevo o se agravan lo ya existentes. EST SEGURO QUE:  Comprende las instrucciones para el alta mdica.  Controlar su enfermedad.  Solicitar atencin mdica de inmediato segn las indicaciones. Document Released: 04/10/2005 Document Revised: 07/03/2011 ExitCare Patient Information 2015 [employer]. This information is not intended to replace advice given to you by your health care provider. Make sure you discuss   any questions you have with your health care provider. Influenza Virus Vaccine injection (Fluarix) Qu es este medicamento? La VACUNA ANTIGRIPAL ayuda a disminuir el riesgo de contraer la influenza, tambin conocida como la gripe. La vacuna solo ayuda a protegerle contra algunas cepas de influenza. Esta vacuna no ayuda a reducir Engineer, water de contraer influenza pandmica H1N1. Este medicamento puede ser utilizado para otros usos; si tiene alguna pregunta consulte con su proveedor de atencin mdica o con su farmacutico. MARCAS COMERCIALES DISPONIBLES: Fluarix, Fluzone Qu le debo informar a mi profesional de la salud antes de tomar este medicamento? Necesita saber si usted presenta alguno de los siguientes problemas o situaciones: -trastorno de sangrado como hemofilia -fiebre o infeccin -sndrome de Guillain-Barre u otros problemas neurolgicos -problemas del sistema inmunolgico -infeccin por el virus de la inmunodeficiencia humana (VIH) o SIDA -niveles bajos de plaquetas en la sangre -esclerosis mltiple -una Automotive engineer o inusual a las vacunas antigripales, a los huevos, protenas de pollo, al ltex, a la gentamicina, a otros medicamentos, alimentos, colorantes o conservantes -si est embarazada o buscando quedar embarazada -si est amamantando a un beb Cmo debo utilizar este medicamento? Esta vacuna se administra mediante inyeccin por va intramuscular. Lo administra un profesional de [employer]. Recibir una copia de informacin escrita sobre la vacuna antes de cada vacuna. Asegrese de leer este folleto cada vez cuidadosamente. Este folleto puede cambiar con frecuencia. Hable con su pediatra para informarse acerca del uso de este medicamento en nios. Puede requerir atencin especial. Sobredosis: Pngase en contacto inmediatamente con un centro toxicolgico o una sala de urgencia si usted cree que haya tomado demasiado medicamento. ATENCIN: [employer] es solo para usted. No comparta este medicamento con nadie. Qu sucede si me olvido de una dosis? No se aplica en este caso. Qu puede interactuar con este medicamento? -quimioterapia o radioterapia -medicamentos que suprimen el sistema inmunolgico, tales como etanercept, anakinra, infliximab y adalimumab -medicamentos que tratan o previenen  cogulos sanguneos, como warfarina -fenitona -medicamentos esteroideos, como la prednisona o la cortisona -teofilina -vacunas Puede ser que esta lista no menciona todas las posibles interacciones. Informe a su profesional de [employer] de [employer] productos a base de hierbas, medicamentos de [place] o suplementos nutritivos que est tomando. Si usted fuma, consume bebidas alcohlicas o si utiliza drogas ilegales, indqueselo tambin a su profesional de [employer]. Algunas sustancias pueden interactuar con su medicamento. A qu debo estar atento al usar [employer]? Informe a su mdico o a Producer, television/film/video de la [employer] todos los efectos secundarios que persistan despus de [address]. Llame a su proveedor de atencin mdica si se presentan sntomas inusuales dentro de las 6 semanas posteriores a la vacunacin. Es posible que todava pueda contraer la gripe, pero la enfermedad no ser tan fuerte como normalmente. No puede contraer la gripe de esta vacuna. La vacuna antigripal no le protege contra resfros u otras enfermedades que pueden causar [place]. Debe vacunarse cada ao. Qu efectos secundarios puedo tener al [employer] este medicamento? Efectos secundarios que debe informar a su mdico o a Producer, television/film/video de la salud tan pronto como sea posible: -Therapist, art como erupcin cutnea, picazn o urticarias, hinchazn de la cara, labios o lengua Efectos secundarios que, por lo general, no requieren atencin mdica (debe informarlos a su mdico o a su profesional de la salud si persisten o si son molestos): -fiebre -dolor de cabeza -molestias y dolores musculares -dolor, sensibilidad, enrojecimiento o Paramedic de la  inyeccin -cansancio o debilidad Puede ser que esta lista no menciona todos los posibles efectos secundarios. Comunquese a su mdico por asesoramiento mdico Hewlett-Packardsobre los efectos secundarios. Usted puede informar los efectos secundarios a la FDA por telfono al  1-800-FDA-1088. Dnde debo guardar mi medicina? Esta vacuna se administra solamente en clnicas, farmacias, consultorio mdico u otro consultorio de un profesional de la salud y no Teacher, early years/prenecesitar guardarlo en su domicilio. ATENCIN: Este folleto es un resumen. Puede ser que no cubra toda la posible informacin. Si usted tiene preguntas acerca de esta medicina, consulte con su mdico, su farmacutico o su profesional de Radiographer, therapeuticla salud.  2015, Elsevier/Gold Standard. (2009-10-12 15:31:40)

## 2014-01-22 NOTE — Progress Notes (Signed)
   Patient presented to the office today with complaint of bloating sensation on her rate labia majora region. Patient was seen in the office in April of this year and was diagnosed with a right Bartholin duct cyst and was to return to have it drained but did not do so until today. The patient has an IUD for contraception. Patient several years ago had a left Bartholin duct cyst.  Exam: Large right Bartholin duct mass.  Patient was counseled for I&D of Bartholin duct cyst/abscess  The area was prepped with Betadine solution. One percent lidocaine was infiltrated submucosally for a total of 15 cc. A small stab incision was made and a Bartholin duct gland expelled copious brown milky like material for which culture for MRSA was obtained. The loculations were taken down with curved hemostat. The area was debrided with hydrogen peroxide. A Word catheter was then placed.  Assessment/plan: Patient status post I&D of right Bartholin duct cyst. Ward t catheter in place. Patient to return back in 2 weeks to remove catheter. Patient will be prescribed Vibramycin 100 mg one by mouth twice a day for 2 weeks to cover for potential MRSA. Prescription for Percocet 5/325 was provided to take one by mouth every 4-6 hours when necessary. Patient received vaccine today.

## 2014-01-25 LAB — WOUND CULTURE
GRAM STAIN: NONE SEEN
Gram Stain: NONE SEEN
ORGANISM ID, BACTERIA: NO GROWTH

## 2014-02-03 ENCOUNTER — Encounter: Payer: Self-pay | Admitting: Gynecology

## 2014-02-03 ENCOUNTER — Ambulatory Visit (INDEPENDENT_AMBULATORY_CARE_PROVIDER_SITE_OTHER): Payer: Commercial Managed Care - PPO | Admitting: Gynecology

## 2014-02-03 ENCOUNTER — Ambulatory Visit: Payer: Commercial Managed Care - PPO | Admitting: Gynecology

## 2014-02-03 VITALS — BP 110/78 | Ht 65.0 in | Wt 145.0 lb

## 2014-02-03 DIAGNOSIS — N750 Cyst of Bartholin's gland: Secondary | ICD-10-CM

## 2014-02-03 NOTE — Progress Notes (Signed)
   The patient was seen in the office on October 1 and had an incision and drainage of a Bartholin duct cyst near there was debridement cultures were obtained with no evidence of MRSA. As we waited for the results of the culture patient had been started on Vibramycin 100 mg twice a day for 2 weeks. A Ward Catheter was placed at that time and she is here to have it removed. She has done well otherwise.  Exam: The Ward catheter was removed. The edges of the incision site where the Bartholin duct cyst was drained has granulation tissue no evidence of infection nontender and nonerythematous. The area was irrigated several times with hydrogen peroxide and Neosporin was applied.  Assessment/plan: Patient status post incision and drainage of right Bartholin duct cyst had done well for 2 weeks with Ward catheter. Patient will continue to keep the area clean and hold off on intercourse for at least one week.

## 2014-02-23 ENCOUNTER — Encounter: Payer: Self-pay | Admitting: Gynecology

## 2014-03-18 ENCOUNTER — Ambulatory Visit: Payer: PRIVATE HEALTH INSURANCE | Admitting: Gynecology

## 2014-10-01 ENCOUNTER — Encounter: Payer: Self-pay | Admitting: Gynecology

## 2014-10-01 ENCOUNTER — Ambulatory Visit (INDEPENDENT_AMBULATORY_CARE_PROVIDER_SITE_OTHER): Payer: Commercial Managed Care - PPO | Admitting: Gynecology

## 2014-10-01 ENCOUNTER — Other Ambulatory Visit (HOSPITAL_COMMUNITY)
Admission: RE | Admit: 2014-10-01 | Discharge: 2014-10-01 | Disposition: A | Discharge status: Home / Self Care | Payer: Commercial Managed Care - PPO | Source: Ambulatory Visit | Attending: Gynecology | Admitting: Gynecology

## 2014-10-01 VITALS — BP 112/62 | Ht 65.75 in | Wt 138.6 lb

## 2014-10-01 DIAGNOSIS — Z124 Encounter for screening for malignant neoplasm of cervix: Secondary | ICD-10-CM

## 2014-10-01 DIAGNOSIS — Z01419 Encounter for gynecological examination (general) (routine) without abnormal findings: Secondary | ICD-10-CM | POA: Diagnosis not present

## 2014-10-01 DIAGNOSIS — Z87410 Personal history of cervical dysplasia: Secondary | ICD-10-CM

## 2014-10-01 DIAGNOSIS — Z1151 Encounter for screening for human papillomavirus (HPV): Secondary | ICD-10-CM | POA: Diagnosis present

## 2014-10-01 LAB — CBC WITH DIFFERENTIAL/PLATELET
Basophils Absolute: 0.1 10*3/uL (ref 0.0–0.1)
Basophils Relative: 1 % (ref 0–1)
EOS ABS: 0.2 10*3/uL (ref 0.0–0.7)
Eosinophils Relative: 3 % (ref 0–5)
HCT: 39.7 % (ref 36.0–46.0)
Hemoglobin: 13.4 g/dL (ref 12.0–15.0)
Lymphocytes Relative: 25 % (ref 12–46)
Lymphs Abs: 1.6 10*3/uL (ref 0.7–4.0)
MCH: 32.9 pg (ref 26.0–34.0)
MCHC: 33.8 g/dL (ref 30.0–36.0)
MCV: 97.5 fL (ref 78.0–100.0)
MPV: 10.2 fL (ref 8.6–12.4)
Monocytes Absolute: 0.4 10*3/uL (ref 0.1–1.0)
Monocytes Relative: 7 % (ref 3–12)
Neutro Abs: 4 10*3/uL (ref 1.7–7.7)
Neutrophils Relative %: 64 % (ref 43–77)
Platelets: 245 10*3/uL (ref 150–400)
RBC: 4.07 MIL/uL (ref 3.87–5.11)
RDW: 13 % (ref 11.5–15.5)
WBC: 6.2 10*3/uL (ref 4.0–10.5)

## 2014-10-01 LAB — LIPID PANEL
Cholesterol: 125 mg/dL (ref 0–200)
HDL: 31 mg/dL — AB (ref >=46)
LDL Cholesterol: 82 mg/dL (ref 0–99)
Total CHOL/HDL Ratio: 4 Ratio
Triglycerides: 61 mg/dL (ref <150)
VLDL: 12 mg/dL (ref 0–40)

## 2014-10-01 LAB — COMPREHENSIVE METABOLIC PANEL
ALT: 10 U/L (ref 0–35)
AST: 16 U/L (ref 0–37)
Albumin: 3.9 g/dL (ref 3.5–5.2)
Alkaline Phosphatase: 40 U/L (ref 39–117)
BILIRUBIN TOTAL: 1 mg/dL (ref 0.2–1.2)
BUN: 17 mg/dL (ref 6–23)
CALCIUM: 9.7 mg/dL (ref 8.4–10.5)
CO2: 28 mEq/L (ref 19–32)
CREATININE: 0.79 mg/dL (ref 0.50–1.10)
Chloride: 103 mEq/L (ref 96–112)
GLUCOSE: 89 mg/dL (ref 70–99)
POTASSIUM: 4.7 meq/L (ref 3.5–5.3)
Sodium: 135 mEq/L (ref 135–145)
TOTAL PROTEIN: 6.8 g/dL (ref 6.0–8.3)

## 2014-10-01 LAB — TSH: TSH: 1.932 u[IU]/mL (ref 0.350–4.500)

## 2014-10-01 NOTE — Progress Notes (Signed)
Heather Orozco 11-12-1975 [id number]   History:    39 y.o.  for annual gyn exam with no complaints today.patient last October had an I&D of a right Bartholin's duct cyst and had a Ward catheter placed and patient has done well after its removal.she had a Mirena IUD placed in 2013.Patient is having no menstrual cycles. 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 Pap smear 2011, 2012 were normal.patient does her monthly breast exams occasionally  .Pap smear was normal in 2012 and 2015  Past medical history,surgical history, family history and social history were all reviewed and documented in the EPIC chart.  Gynecologic History No LMP recorded. Patient is not currently having periods (Reason: IUD). Contraception: IUD Last Pap: 2015. Results were: normal Last mammogram: not indicated. Results were: not indicated  Obstetric History OB History  Gravida Para Term Preterm AB SAB TAB Ectopic Multiple Living  1 1 1       1     # Outcome Date GA Lbr Len/2nd Weight Sex Delivery Anes PTL Lv  1 Term     F CS-Unspec  N Y       ROS: A ROS was performed and pertinent positives and negatives are included in the history.  GENERAL: No fevers or chills. HEENT: No change in vision, no earache, sore throat or sinus congestion. NECK: No pain or stiffness. CARDIOVASCULAR: No chest pain or pressure. No palpitations. PULMONARY: No shortness of breath, cough or wheeze. GASTROINTESTINAL: No abdominal pain, nausea, vomiting or diarrhea, melena or bright red blood per rectum. GENITOURINARY: No urinary frequency, urgency, hesitancy or dysuria. MUSCULOSKELETAL: No joint or muscle pain, no back pain, no recent trauma. DERMATOLOGIC: No rash, no itching, no lesions. ENDOCRINE: No polyuria, polydipsia, no heat or cold intolerance. No recent change in weight. HEMATOLOGICAL: No anemia or easy bruising or bleeding. NEUROLOGIC: No headache, seizures,  numbness, tingling or weakness. PSYCHIATRIC: No depression, no loss of interest in normal activity or change in sleep pattern.     Exam: chaperone present  BP 112/62 mmHg  Ht 5' 5.75" (1.67 m)  Wt 138 lb 9.6 oz (62.869 kg)  BMI 22.54 kg/m2  Body mass index is 22.54 kg/(m^2).  General appearance : Well developed well nourished female. No acute distress HEENT: Eyes: no retinal hemorrhage or exudates,  Neck supple, trachea midline, no carotid bruits, no thyroidmegaly Lungs: Clear to auscultation, no rhonchi or wheezes, or rib retractions  Heart: Regular rate and rhythm, no murmurs or gallops Breast:Examined in sitting and supine position were symmetrical in appearance, no palpable masses or tenderness,  no skin retraction, no nipple inversion, no nipple discharge, no skin discoloration, no axillary or supraclavicular lymphadenopathy Abdomen: no palpable masses or tenderness, no rebound or guarding Extremities: no edema or skin discoloration or tenderness  Pelvic:  Bartholin, Urethra, Skene Glands: Within normal limits             Vagina: No gross lesions or discharge  Cervix: No gross lesions or discharge, IUD string seen  Uterus  anteverted, normal size, shape and consistency, non-tender and mobile  Adnexa  Without masses or tenderness  Anus and perineum  normal   Rectovaginal  normal sphincter tone without palpated masses or tenderness             Hemoccult not indicated     Assessment/Plan:  38 y.o. female for annual exam doing well. She will hit baseline mammogram next year. The following  fasting screening blood work was ordered: Fasting lipid profile, comprehensive metabolic panel, TSH, CBC, and urinalysis. Pap smear with HPV screening was done today. If this Pap smear is normal we would be 3 years in a row her Pap smears of been normal and we will then go every 3 years as per guidelines. Patient is instructed to do her monthly breast exam.   Ok Edwards MD, 9:05 AM  10/01/2014

## 2014-10-01 NOTE — Addendum Note (Signed)
Addended by: Richardson Chiquito on: 10/01/2014 10:55 AM   Modules accepted: Orders

## 2014-10-02 LAB — URINALYSIS W MICROSCOPIC + REFLEX CULTURE
BILIRUBIN URINE: NEGATIVE
CASTS: NONE SEEN
CRYSTALS: NONE SEEN
GLUCOSE, UA: NEGATIVE mg/dL
HGB URINE DIPSTICK: NEGATIVE
Ketones, ur: NEGATIVE mg/dL
LEUKOCYTES UA: NEGATIVE
NITRITE: NEGATIVE
PH: 7.5 (ref 5.0–8.0)
PROTEIN: NEGATIVE mg/dL
SPECIFIC GRAVITY, URINE: 1.018 (ref 1.005–1.030)
Urobilinogen, UA: 0.2 mg/dL (ref 0.0–1.0)

## 2014-10-02 LAB — CYTOLOGY - PAP

## 2015-10-07 ENCOUNTER — Other Ambulatory Visit: Payer: Self-pay | Admitting: Gynecology

## 2015-10-07 ENCOUNTER — Encounter: Payer: Self-pay | Admitting: Gynecology

## 2015-10-07 ENCOUNTER — Ambulatory Visit (INDEPENDENT_AMBULATORY_CARE_PROVIDER_SITE_OTHER): Payer: Commercial Managed Care - PPO | Admitting: Gynecology

## 2015-10-07 VITALS — BP 108/76 | Ht 65.75 in | Wt 135.0 lb

## 2015-10-07 DIAGNOSIS — Z01419 Encounter for gynecological examination (general) (routine) without abnormal findings: Secondary | ICD-10-CM | POA: Diagnosis not present

## 2015-10-07 DIAGNOSIS — Z1231 Encounter for screening mammogram for malignant neoplasm of breast: Secondary | ICD-10-CM

## 2015-10-07 NOTE — Progress Notes (Signed)
Heather Orozco 28-Mar-1976 [id number]   History:    40 y.o.  for annual gyn exam with no complaints today has been doing well with a Mirena IUD that was placed in 2013. 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 Pap smear 2011, 2012 and 2016 were normal.  Past medical history,surgical history, family history and social history were all reviewed and documented in the EPIC chart.  Gynecologic History No LMP recorded. Patient is not currently having periods (Reason: IUD). Contraception: IUD Last Pap: 2016. Results were: normal Last mammogram: No previous study. Results were: No previous study  Obstetric History OB History  Gravida Para Term Preterm AB SAB TAB Ectopic Multiple Living  # Outcome Date GA Lbr Len/2nd Weight Sex Delivery Anes PTL Lv  1 Term     F CS-Unspec  N Y       ROS: A ROS was performed and pertinent positives and negatives are included in the history.  GENERAL: No fevers or chills. HEENT: No change in vision, no earache, sore throat or sinus congestion. NECK: No pain or stiffness. CARDIOVASCULAR: No chest pain or pressure. No palpitations. PULMONARY: No shortness of breath, cough or wheeze. GASTROINTESTINAL: No abdominal pain, nausea, vomiting or diarrhea, melena or bright red blood per rectum. GENITOURINARY: No urinary frequency, urgency, hesitancy or dysuria. MUSCULOSKELETAL: No joint or muscle pain, no back pain, no recent trauma. DERMATOLOGIC: No rash, no itching, no lesions. ENDOCRINE: No polyuria, polydipsia, no heat or cold intolerance. No recent change in weight. HEMATOLOGICAL: No anemia or easy bruising or bleeding. NEUROLOGIC: No headache, seizures, numbness, tingling or weakness. PSYCHIATRIC: No depression, no loss of interest in normal activity or change in sleep pattern.     Exam: chaperone  present  BP 108/76 mmHg  Ht 5' 5.75" (1.67 m)  Wt 135 lb (61.236 kg)  BMI 21.96 kg/m2  Body mass index is 21.96 kg/(m^2).  General appearance : Well developed well nourished female. No acute distress HEENT: Eyes: no retinal hemorrhage or exudates,  Neck supple, trachea midline, no carotid bruits, no thyroidmegaly Lungs: Clear to auscultation, no rhonchi or wheezes, or rib retractions  Heart: Regular rate and rhythm, no murmurs or gallops Breast:Examined in sitting and supine position were symmetrical in appearance, no palpable masses or tenderness,  no skin retraction, no nipple inversion, no nipple discharge, no skin discoloration, no axillary or supraclavicular lymphadenopathy Abdomen: no palpable masses or tenderness, no rebound or guarding Extremities: no edema or skin discoloration or tenderness  Pelvic:  Bartholin, Urethra, Skene Glands: Within normal limits             Vagina: No gross lesions or discharge  Cervix: No gross lesions or discharge  Uterus  anteverted, normal size, shape and consistency, non-tender and mobile  Adnexa  Without masses or tenderness  Anus and perineum  normal   Rectovaginal  normal sphincter tone without palpated masses or tenderness             Hemoccult not indicated     Assessment/Plan:  40 y.o. female for annual exam was given a requisition to do her fasting blood work which she states she can get done at work: Comprehensive metabolic panel, fasting lipid profile, TSH, CBC. Urinalysis will be done here today. Pap smear not indicated this  year. Next year we'll need to change her Mirena IUD. She was given a requisition to schedule her baseline mammogram and encouraged to do her monthly breast exam.   Ok EdwardsFERNANDEZ,JUAN H MD, 11:25 AM 10/07/2015

## 2015-10-08 LAB — URINALYSIS W MICROSCOPIC + REFLEX CULTURE
BACTERIA UA: NONE SEEN [HPF]
Bilirubin Urine: NEGATIVE
CASTS: NONE SEEN [LPF]
CRYSTALS: NONE SEEN [HPF]
Glucose, UA: NEGATIVE
HGB URINE DIPSTICK: NEGATIVE
Leukocytes, UA: NEGATIVE
Nitrite: NEGATIVE
PROTEIN: NEGATIVE
Specific Gravity, Urine: 1.028 (ref 1.001–1.035)
WBC, UA: NONE SEEN WBC/HPF (ref <=5)
YEAST: NONE SEEN [HPF]
pH: 6.5 (ref 5.0–8.0)

## 2015-10-09 LAB — URINE CULTURE: Colony Count: 50000

## 2015-10-15 ENCOUNTER — Telehealth: Payer: Self-pay

## 2015-10-15 NOTE — Telephone Encounter (Signed)
Dr. Glenetta HewJF sent a handwritten note out attached to patients recent [facility] labs that were received. The note reads "Inform patient I have reviewed her labs and that one of her kidney function tests is elevated and a liver function test. Will need to repeat next week. Make sure she is well hydrated.  BUN, creatinine, total/direct and indirect bilirubin."  I spoke with patient and advised her. She wants to have this drawn at the same place again. She will call me back with a fax number to send the order .

## 2015-10-27 ENCOUNTER — Telehealth: Payer: Self-pay

## 2015-10-27 NOTE — Telephone Encounter (Signed)
Patient called me with fax number to [facility]. She had recent labs there and Dr. Glenetta HewJF recommended recheck on kidney and LFT. She was reminded to be well hydrated when she repeats. I let her know order has been faxed.

## 2015-11-02 ENCOUNTER — Telehealth: Payer: Self-pay

## 2015-11-02 ENCOUNTER — Other Ambulatory Visit: Payer: Self-pay | Admitting: Gynecology

## 2015-11-02 NOTE — Telephone Encounter (Signed)
Dr. Glenetta HewJF received follow up labs [facility]. After review he asked me to let patient know that result have come down.  He recommended waiting and repeating again in 2 weeks and to be sure she is well hydrated for several days before.    I spoke with patient and relayed this information and [facility] were [facility].

## 2015-12-30 ENCOUNTER — Other Ambulatory Visit: Payer: Self-pay | Admitting: Gynecology

## 2015-12-30 ENCOUNTER — Ambulatory Visit
Admission: RE | Admit: 2015-12-30 | Discharge: 2015-12-30 | Disposition: A | Discharge status: Home / Self Care | Payer: Commercial Managed Care - PPO | Source: Ambulatory Visit | Attending: Gynecology | Admitting: Gynecology

## 2015-12-30 DIAGNOSIS — Z1231 Encounter for screening mammogram for malignant neoplasm of breast: Secondary | ICD-10-CM

## 2016-01-03 ENCOUNTER — Other Ambulatory Visit: Payer: Self-pay | Admitting: Gynecology

## 2016-01-03 DIAGNOSIS — R928 Other abnormal and inconclusive findings on diagnostic imaging of breast: Secondary | ICD-10-CM

## 2016-01-14 ENCOUNTER — Telehealth: Payer: Self-pay | Admitting: *Deleted

## 2016-01-14 NOTE — Telephone Encounter (Signed)
Pt had call back for repeat imaging due to breast calcifications found on mammogram, pt didn't understand what this meant. I explained to pt the need to follow up for diag. Mammogram and ultrasound as recommended by radiologist. Pt verbalized she understood and will schedule sooner than later.

## 2016-02-14 ENCOUNTER — Ambulatory Visit
Admission: RE | Admit: 2016-02-14 | Discharge: 2016-02-14 | Disposition: A | Discharge status: Home / Self Care | Payer: Commercial Managed Care - PPO | Source: Ambulatory Visit | Attending: Gynecology | Admitting: Gynecology

## 2016-02-14 DIAGNOSIS — R928 Other abnormal and inconclusive findings on diagnostic imaging of breast: Secondary | ICD-10-CM

## 2016-09-06 ENCOUNTER — Encounter: Payer: Self-pay | Admitting: Gynecology

## 2016-10-09 ENCOUNTER — Encounter: Payer: Commercial Managed Care - PPO | Admitting: Gynecology

## 2016-10-12 ENCOUNTER — Encounter: Payer: Commercial Managed Care - PPO | Admitting: Gynecology

## 2017-03-07 ENCOUNTER — Other Ambulatory Visit: Payer: Self-pay | Admitting: Obstetrics & Gynecology

## 2017-03-07 DIAGNOSIS — R921 Mammographic calcification found on diagnostic imaging of breast: Secondary | ICD-10-CM

## 2017-03-23 ENCOUNTER — Ambulatory Visit
Admission: RE | Admit: 2017-03-23 | Discharge: 2017-03-23 | Disposition: A | Discharge status: Home / Self Care | Payer: Commercial Managed Care - PPO | Source: Ambulatory Visit | Attending: Obstetrics & Gynecology | Admitting: Obstetrics & Gynecology

## 2017-03-23 ENCOUNTER — Other Ambulatory Visit: Payer: Self-pay | Admitting: Obstetrics & Gynecology

## 2017-03-23 DIAGNOSIS — N6310 Unspecified lump in the right breast, unspecified quadrant: Secondary | ICD-10-CM

## 2017-03-23 DIAGNOSIS — N6489 Other specified disorders of breast: Secondary | ICD-10-CM

## 2017-03-23 DIAGNOSIS — R921 Mammographic calcification found on diagnostic imaging of breast: Secondary | ICD-10-CM

## 2017-04-03 ENCOUNTER — Other Ambulatory Visit: Payer: Self-pay | Admitting: Obstetrics & Gynecology

## 2017-04-03 ENCOUNTER — Ambulatory Visit
Admission: RE | Admit: 2017-04-03 | Discharge: 2017-04-03 | Disposition: A | Discharge status: Home / Self Care | Payer: Commercial Managed Care - PPO | Source: Ambulatory Visit | Attending: Obstetrics & Gynecology | Admitting: Obstetrics & Gynecology

## 2017-04-03 DIAGNOSIS — N6489 Other specified disorders of breast: Secondary | ICD-10-CM

## 2017-09-11 ENCOUNTER — Other Ambulatory Visit (HOSPITAL_COMMUNITY): Payer: Self-pay | Admitting: Family Medicine

## 2017-09-11 DIAGNOSIS — R011 Cardiac murmur, unspecified: Secondary | ICD-10-CM

## 2017-09-20 ENCOUNTER — Ambulatory Visit (HOSPITAL_COMMUNITY)
Admission: RE | Admit: 2017-09-20 | Discharge: 2017-09-20 | Disposition: A | Discharge status: Home / Self Care | Payer: Commercial Managed Care - PPO | Source: Ambulatory Visit | Attending: Family Medicine | Admitting: Family Medicine

## 2017-09-20 DIAGNOSIS — R011 Cardiac murmur, unspecified: Secondary | ICD-10-CM | POA: Insufficient documentation

## 2017-09-20 DIAGNOSIS — I340 Nonrheumatic mitral (valve) insufficiency: Secondary | ICD-10-CM | POA: Insufficient documentation

## 2017-09-20 NOTE — Progress Notes (Signed)
  Echocardiogram 2D Echocardiogram has been performed.  Heather Orozco 09/20/2017, 8:47 AM

## 2017-09-24 ENCOUNTER — Other Ambulatory Visit (HOSPITAL_COMMUNITY): Payer: Commercial Managed Care - PPO

## 2018-08-10 IMAGING — MG 2D DIGITAL DIAGNOSTIC BILATERAL MAMMOGRAM WITH CAD AND ADJUNCT T
8 of 18 series · 8 of 40 positions shown · non-contrast
Comparison: Previous exam(s).

ADDENDUM:
The patient presented today, 04/03/2017, for stereotactic biopsy of
the right breast distortion identified on her previous diagnostic
mammogram. No persistent abnormality was identified at in the right
breast to target for biopsy. A full true lateral tomosynthesis
mammogram was performed, and no persistent distortion was identified
on this image either. Therefore, the biopsy was canceled.

Recommendation: A six-month follow-up right breast mammogram is
recommended to ensure stability.
BI-RADS 1: Normal.
CLINICAL DATA: 41-year-old female presenting for annual bilateral
mammogram and delayed follow-up for probably benign left breast
calcifications.
EXAM:
2D DIGITAL DIAGNOSTIC BILATERAL MAMMOGRAM WITH CAD AND ADJUNCT TOMO
ULTRASOUND RIGHT BREAST

[L CC]
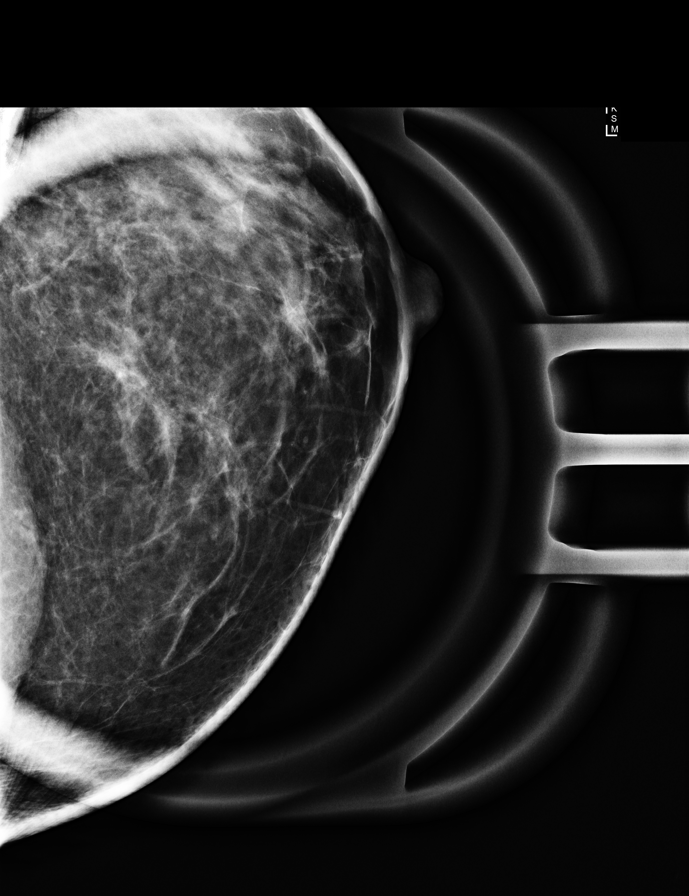

[L ML]
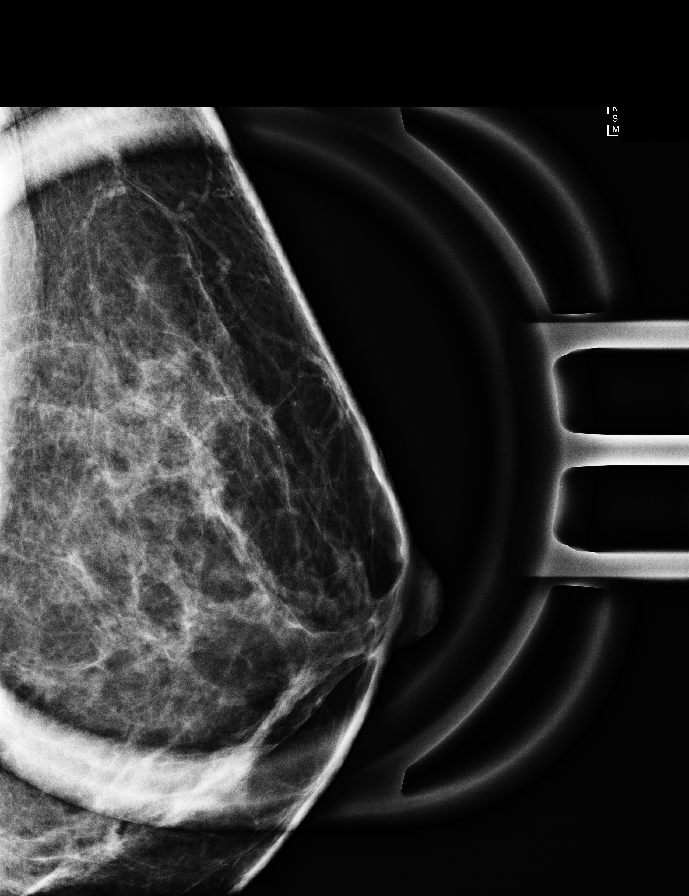

[R MLO (1 of 2)]
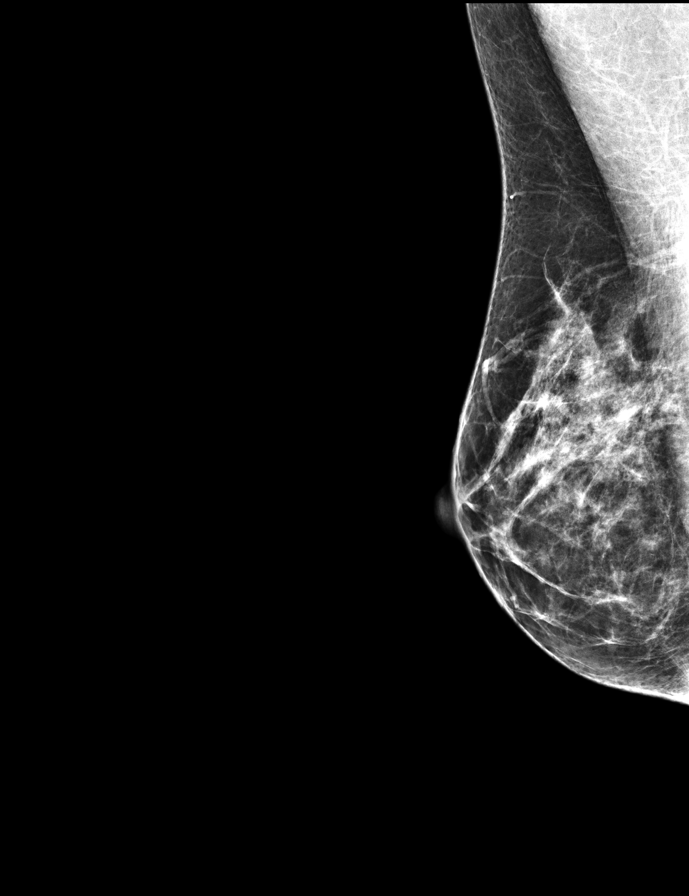

[R MLO (2 of 2)]
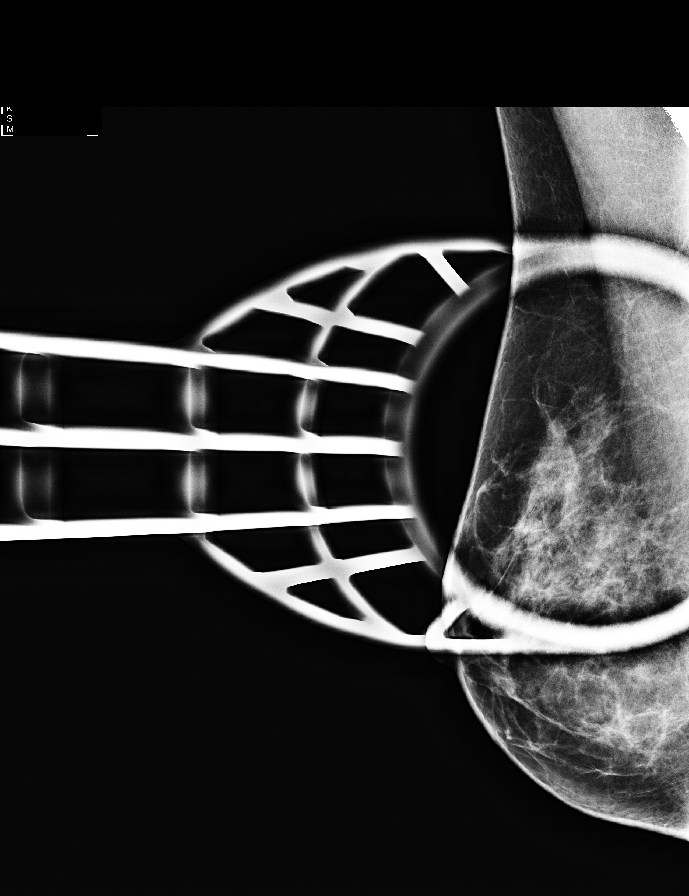

[R ML synth-2D]
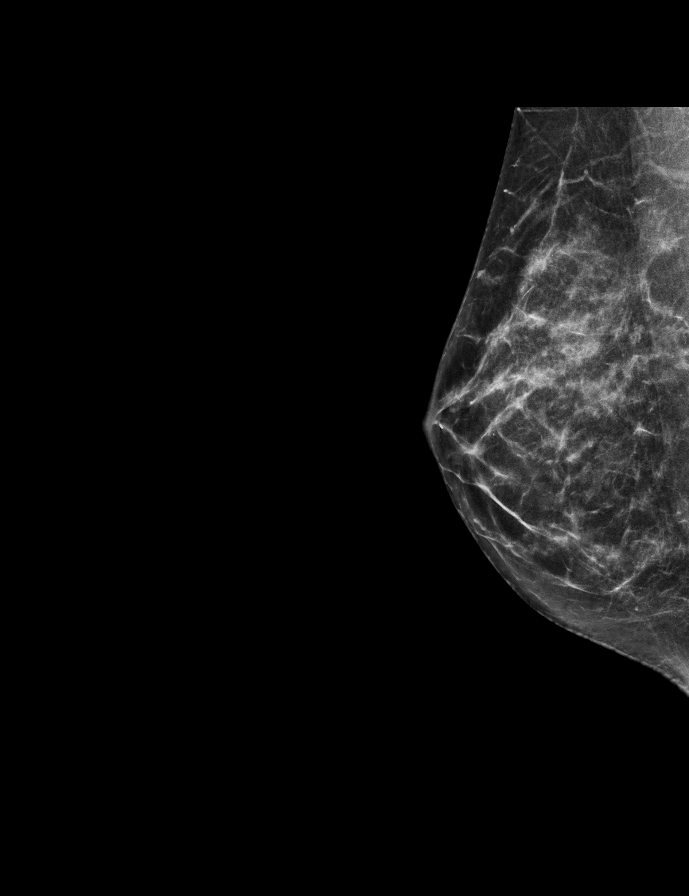

[L CC synth-2D]
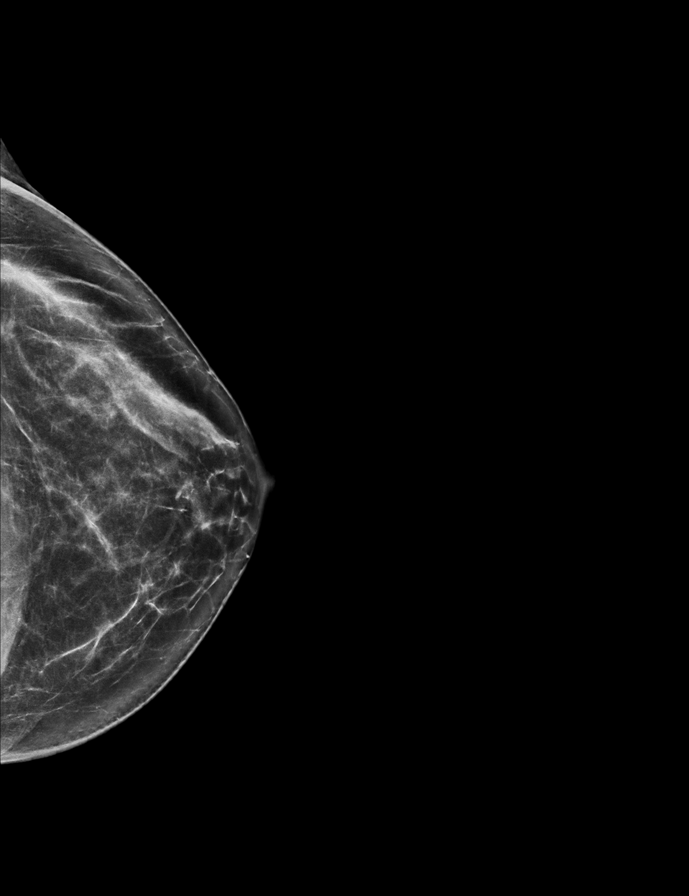

[R ML]
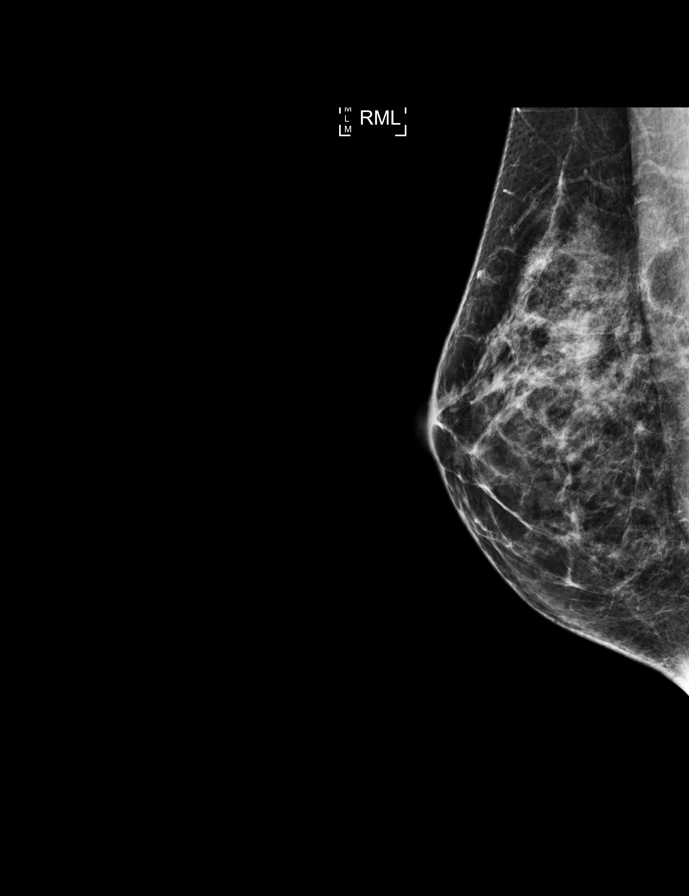

[R CC]
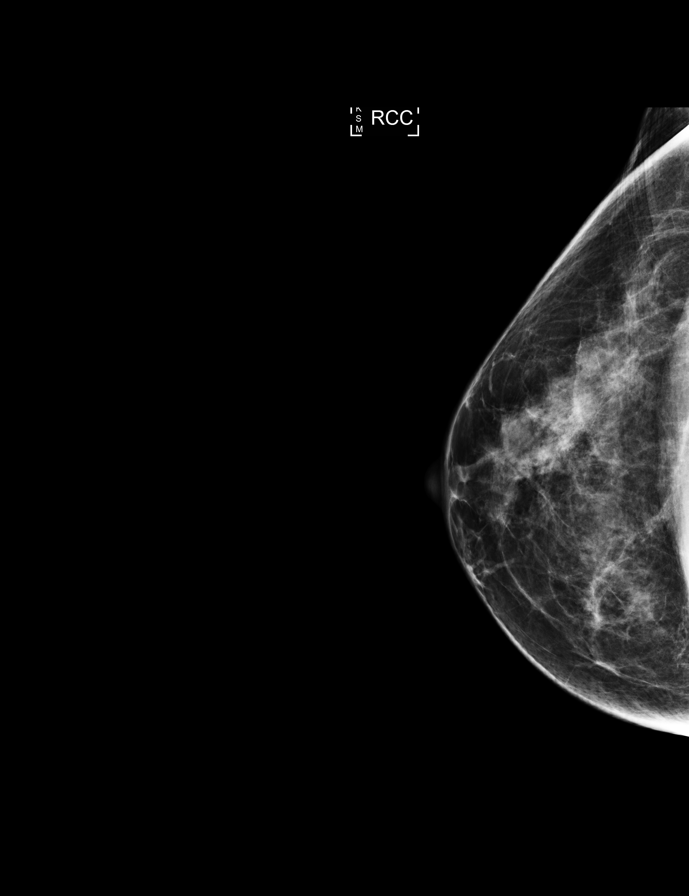

[8 of 40 positions shown; findings below may reference images not displayed]

ACR Breast Density Category c: The breast tissue is heterogeneously
dense, which may obscure small masses.
FINDINGS: Previously described calcifications in the upper inner left breast
at middle depth have resolved in the interim. No suspicious
mammographic findings are identified on the left.

An area of distortion is identified in the far posterior central
right breast on the MLO projection only. This area persists on
additional spot compression views. Further evaluation with
ultrasound was performed.

Mammographic images were processed with CAD.

Targeted ultrasound is performed, showing evaluation of the entire
upper outer quadrant of the right breast demonstrates no focal areas
of distortion, mass or sonographic abnormalities to correspond with
the mammographic finding.
IMPRESSION: 1. Focal right breast distortion without ultrasound correlate.
Recommendation is for stereotactic biopsy.
2. Interval resolution of the previously described left breast
calcifications. No other suspicious mammographic findings on the
left.

RECOMMENDATION:
Stereotactic biopsy of right breast distortion. This area is best
seen on the MLO projection.

I have discussed the findings and recommendations with the patient.
Results were also provided in writing at the conclusion of the
visit. If applicable, a reminder letter will be sent to the patient
regarding the next appointment.

BI-RADS CATEGORY  4: Suspicious.

## 2018-10-23 ENCOUNTER — Other Ambulatory Visit: Payer: Self-pay | Admitting: Family Medicine

## 2018-10-23 DIAGNOSIS — G8929 Other chronic pain: Secondary | ICD-10-CM

## 2023-10-19 LAB — COLOGUARD: COLOGUARD: POSITIVE — AB

## 2023-11-06 ENCOUNTER — Encounter: Payer: Self-pay | Admitting: Gastroenterology

## 2023-11-06 ENCOUNTER — Encounter: Payer: Self-pay | Admitting: *Deleted

## 2023-11-06 ENCOUNTER — Ambulatory Visit (AMBULATORY_SURGERY_CENTER)

## 2023-11-06 VITALS — Ht 66.0 in | Wt 142.0 lb

## 2023-11-06 DIAGNOSIS — Z1211 Encounter for screening for malignant neoplasm of colon: Secondary | ICD-10-CM

## 2023-11-06 MED ORDER — NA SULFATE-K SULFATE-MG SULF 17.5-3.13-1.6 GM/177ML PO SOLN: 1.0000 | Freq: Once | ORAL | 0 refills | Status: AC

## 2023-11-06 NOTE — Progress Notes (Addendum)
 Pt's name and DOB verified at the beginning of the pre-visit wit 2 identifiers  Pt denies any difficulty with ambulating,sitting, laying down or rolling side to side  Pt has no issues moving head neck or swallowing  No egg or soy allergy known to patient   No issues known to pt with past sedation with any surgeries or procedures  Patient denies ever being intubated  No FH of Malignant Hyperthermia  Pt is not on home 02   Pt is not on blood thinners   Pt has frequent issues with constipation RN instructed pt to use Miralax per bottles instructions a week before prep days. Pt states they will  Pt is not on dialysis  Pt denise any abnormal heart rhythms   Pt denies any upcoming cardiac testing  Patient's chart reviewed by Norleen Schillings CNRA prior to pre-visit and patient appropriate for the [facility].  Pre-visit completed and red dot placed by patient's name on their procedure day (on provider's schedule).    Visit by phone  Pt states weight is 142 lb   IInstructions reviewed. Pt given  both [facility] main # and MD on call # prior to instructions.  Pt states understanding of instructions. Instructed pt to review instructions again prior to procedure and call main # given if has questions.. Pt states they will.   Instructed pt on where to find instructions on My Chart.

## 2023-11-29 ENCOUNTER — Ambulatory Visit: Admitting: Gastroenterology

## 2023-11-29 ENCOUNTER — Encounter: Payer: Self-pay | Admitting: Gastroenterology

## 2023-11-29 VITALS — BP 84/60 | HR 59 | Temp 98.2°F | Resp 9 | Ht 66.0 in | Wt 142.0 lb

## 2023-11-29 DIAGNOSIS — D120 Benign neoplasm of cecum: Secondary | ICD-10-CM

## 2023-11-29 DIAGNOSIS — Z1211 Encounter for screening for malignant neoplasm of colon: Secondary | ICD-10-CM

## 2023-11-29 DIAGNOSIS — D122 Benign neoplasm of ascending colon: Secondary | ICD-10-CM

## 2023-11-29 MED ORDER — SODIUM CHLORIDE 0.9 % IV SOLN: 500.0000 mL | Freq: Once | INTRAVENOUS | Status: DC

## 2023-11-29 NOTE — Progress Notes (Signed)
 History and Physical:  This patient presents for endoscopic testing for: Encounter Diagnosis  Name Primary?   Special screening for malignant neoplasms, colon Yes    Average risk for colorectal cancer.  1st screening exam.  Patient denies chronic abdominal pain, rectal bleeding, constipation or diarrhea.   Patient is otherwise without complaints or active issues today.   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      levonorgestrel  (MIRENA ) 20 MCG/24HR IUD 1 each by Intrauterine route once. Reported on 10/07/2015 (Patient taking differently: 1 each by Intrauterine route once. Reported on 10/07/2015 Meal replacement shake with Multivitamin)     Current Facility-Administered Medications  Medication Dose Route Frequency Provider Last Rate Last Admin   0.9 %  sodium chloride  infusion  500 mL Intravenous Once Danis, Kriston Mckinnie L III, MD          ___________________________________________________________________ Objective   Exam:  BP 110/66   Pulse 66   Temp 98.2 F (36.8 C) (Temporal)   Ht 5' 6 (1.676 m)   Wt 142 lb (64.4 kg)   SpO2 99%   BMI 22.92 kg/m   CV: regular , S1/S2 Resp: clear to auscultation bilaterally, normal RR and effort noted GI: soft, no tenderness, with active bowel sounds.   Assessment: Encounter Diagnosis  Name Primary?   Special  screening for malignant neoplasms, colon Yes     Plan: Colonoscopy   The benefits and risks of the planned procedure(s) were described in detail with the patient or (when appropriate) their health care proxy.  Risks were outlined as including, but not limited to, bleeding, infection, perforation, adverse medication reaction leading to cardiac or pulmonary decompensation, pancreatitis (if ERCP).  The limitation of incomplete mucosal visualization was also discussed.  No guarantees or warranties were given.  The patient is appropriate for an endoscopic procedure in the ambulatory setting.   - Victory Brand, MD

## 2023-11-29 NOTE — Progress Notes (Signed)
 Called to room to assist during endoscopic procedure.  Patient ID and intended procedure confirmed with present staff. Received instructions for my participation in the procedure from the performing physician.

## 2023-11-29 NOTE — Op Note (Signed)
 [facility] Patient Name: Heather Orozco Procedure Date: 11/29/2023 8:08 AM MRN: [id number] Endoscopist: Victory L. Legrand , MD, [id number] Age: 48 Referring MD:  Date of Birth: Aug 31, 1975 Gender: Female Account #: [id number] Procedure:                Colonoscopy Indications:              Screening for colorectal malignant neoplasm, This                            is the patient's first colonoscopy Medicines:                Monitored Anesthesia Care Procedure:                Pre-Anesthesia Assessment:                           - Prior to the procedure, a History and Physical                            was performed, and patient medications and                            allergies were reviewed. The patient's tolerance of                            previous anesthesia was also reviewed. The risks                            and benefits of the procedure and the sedation                            options and risks were discussed with the patient.                            All questions were answered, and informed consent                            was obtained. Prior Anticoagulants: The patient has                            taken no anticoagulant or antiplatelet agents. ASA                            Grade Assessment: II - A patient with mild systemic                            disease. After reviewing the risks and benefits,                            the patient was deemed in satisfactory condition to                            undergo the procedure.  After obtaining informed consent, the colonoscope                            was passed under direct vision. Throughout the                            procedure, the patient's blood pressure, pulse, and                            oxygen saturations were monitored continuously. The                            Olympus Scope [id number] was introduced through the                            anus and advanced to  the the cecum, identified by                            appendiceal orifice and ileocecal valve. The                            colonoscopy was performed without difficulty. The                            patient tolerated the procedure well. The quality                            of the bowel preparation was excellent. The                            ileocecal valve, appendiceal orifice, and rectum                            were photographed. Scope In: 8:18:58 AM Scope Out: 8:39:00 AM Scope Withdrawal Time: 0 hours 17 minutes 22 seconds  Total Procedure Duration: 0 hours 20 minutes 2 seconds  Findings:                 The perianal and digital rectal examinations were                            normal.                           Repeat examination of right colon under NBI                            performed.                           Two sessile polyps were found in the cecum ( one of                            them on the ICV). The polyps were 3 to 6 mm in  size. These polyps were removed with a cold snare.                            Resection and retrieval were complete. (Jar 1)                           An 8 mm polyp was found in the proximal ascending                            colon. The polyp was semi-pedunculated. The polyp                            was removed with a hot snare. Resection and                            retrieval were complete. (Jar 1)                           The exam was otherwise without abnormality on                            direct and retroflexion views. Complications:            No immediate complications. Estimated Blood Loss:     Estimated blood loss was minimal. Impression:               - Two 3 to 6 mm polyps in the cecum, removed with a                            cold snare. Resected and retrieved.                           - One 8 mm polyp in the proximal ascending colon,                            removed with a hot  snare. Resected and retrieved.                           - The examination was otherwise normal on direct                            and retroflexion views. Recommendation:           - Patient has a contact number available for                            emergencies. The signs and symptoms of potential                            delayed complications were discussed with the                            patient. Return to normal activities tomorrow.  Written discharge instructions were provided to the                            patient.                           - Resume previous diet.                           - Continue present medications.                           - Await pathology results.                           - Repeat colonoscopy is recommended for                            surveillance. The colonoscopy date will be                            determined after pathology results from today's                            exam become available for review. Heather Orozco L. Legrand, MD 11/29/2023 8:44:28 AM This report has been signed electronically.

## 2023-11-29 NOTE — Patient Instructions (Addendum)
 Continue present medications. Await pathology results.                           - Repeat colonoscopy is recommended for                            surveillance. The colonoscopy date will be                            determined after pathology results from today's                            exam become available for review.  Please read over handout  YOU HAD AN ENDOSCOPIC PROCEDURE TODAY AT THE [facility]:   Refer to the procedure report that was given to you for any specific questions about what was found during the examination.  If the procedure report does not answer your questions, please call your gastroenterologist to clarify.  If you requested that your care partner not be given the details of your procedure findings, then the procedure report has been included in a sealed envelope for you to review at your convenience later.  YOU SHOULD EXPECT: Some feelings of bloating in the abdomen. Passage of more gas than usual.  Walking can help get rid of the air that was put into your GI tract during the procedure and reduce the bloating. If you had a lower endoscopy (such as a colonoscopy or flexible sigmoidoscopy) you may notice spotting of blood in your stool or on the toilet paper. If you underwent a bowel prep for your procedure, you may not have a normal bowel movement for a few days.  Please Note:  You might notice some irritation and congestion in your nose or some drainage.  This is from the oxygen used during your procedure.  There is no need for concern and it should clear up in a day or so.  SYMPTOMS TO REPORT IMMEDIATELY:  Following lower endoscopy (colonoscopy or flexible sigmoidoscopy):  Excessive amounts of blood in the stool  Significant tenderness or worsening of abdominal pains  Swelling of the abdomen that is new, acute  Fever of 100F or higher  For urgent or emergent issues, a gastroenterologist can be reached at any hour by calling [phone]. Do not  use MyChart messaging for urgent concerns.    DIET:  We do recommend a small meal at first, but then you may proceed to your regular diet.  Drink plenty of fluids but you should avoid alcoholic beverages for 24 hours.  ACTIVITY:  You should plan to take it easy for the rest of today and you should NOT DRIVE or use heavy machinery until tomorrow (because of the sedation medicines used during the test).    FOLLOW UP: Our staff will call the number listed on your records the next business day following your procedure.  We will call around 7:15- 8:00 am to check on you and address any questions or concerns that you may have regarding the information given to you following your procedure. If we do not reach you, we will leave a message.     If any biopsies were taken you will be contacted by phone or by letter within the next 1-3 weeks.  Please call us  at [phone] if you have not  heard about the biopsies in 3 weeks.    SIGNATURES/CONFIDENTIALITY: You and/or your care partner have signed paperwork which will be entered into your electronic medical record.  These signatures attest to the fact that that the information above on your After Visit Summary has been reviewed and is understood.  Full responsibility of the confidentiality of this discharge information lies with you and/or your care-partner.

## 2023-11-29 NOTE — Progress Notes (Signed)
 Vss nad trans to pacu

## 2023-11-29 NOTE — Progress Notes (Signed)
 Pt's states no medical or surgical changes since previsit or office visit.

## 2023-11-30 ENCOUNTER — Telehealth: Payer: Self-pay | Admitting: *Deleted

## 2023-11-30 NOTE — Telephone Encounter (Signed)
  Follow up Call-     11/29/2023    7:23 AM  Call back number  Post procedure Call Back phone  # [phone]  Permission to leave phone message Yes   Left message on machine to call back if any questions or concerns

## 2023-12-03 LAB — SURGICAL PATHOLOGY

## 2023-12-04 ENCOUNTER — Ambulatory Visit: Payer: Self-pay | Admitting: Gastroenterology

## 2023-12-12 ENCOUNTER — Telehealth: Payer: Self-pay | Admitting: Gastroenterology

## 2023-12-12 NOTE — Telephone Encounter (Signed)
 Inbound call from patient  requesting a call to discuss 8/7 colonoscopy results further. Please advise, thank you

## 2023-12-13 NOTE — Telephone Encounter (Signed)
 Left message of my call.

## 2023-12-18 NOTE — Telephone Encounter (Signed)
 Called and discussed colonoscopy results with patient. She had multiple questions regarding the type of polyps removed and how they were removed. All questions answered. Patient verbalized understanding. Instructed patient to call back if she thought of any further questions.
